# Patient Record
Sex: Male | Born: 1955 | Race: Black or African American | Hispanic: No | Marital: Married | Smoking: Former smoker
Health system: Southern US, Community
[De-identification: ages and names within clinical notes are randomized; demographics above are authoritative.]

## PROBLEM LIST (undated history)

## (undated) DIAGNOSIS — I219 Acute myocardial infarction, unspecified: Secondary | ICD-10-CM

## (undated) DIAGNOSIS — I10 Essential (primary) hypertension: Secondary | ICD-10-CM

## (undated) DIAGNOSIS — E785 Hyperlipidemia, unspecified: Secondary | ICD-10-CM

## (undated) DIAGNOSIS — K649 Unspecified hemorrhoids: Secondary | ICD-10-CM

## (undated) HISTORY — DX: Essential (primary) hypertension: I10

## (undated) HISTORY — PX: APPENDECTOMY: SHX54

## (undated) HISTORY — DX: Hyperlipidemia, unspecified: E78.5

## (undated) HISTORY — DX: Acute myocardial infarction, unspecified: I21.9

## (undated) HISTORY — DX: Unspecified hemorrhoids: K64.9

---

## 2004-02-02 ENCOUNTER — Other Ambulatory Visit: Payer: Self-pay

## 2004-07-30 ENCOUNTER — Emergency Department: Payer: Self-pay | Admitting: Emergency Medicine

## 2004-08-24 ENCOUNTER — Emergency Department: Payer: Self-pay | Admitting: Emergency Medicine

## 2004-09-22 ENCOUNTER — Ambulatory Visit: Payer: Self-pay | Admitting: Internal Medicine

## 2004-11-17 ENCOUNTER — Ambulatory Visit: Payer: Self-pay | Admitting: Gastroenterology

## 2005-04-09 ENCOUNTER — Emergency Department: Payer: Self-pay | Admitting: Emergency Medicine

## 2005-07-31 ENCOUNTER — Emergency Department: Payer: Self-pay | Admitting: Emergency Medicine

## 2006-10-14 ENCOUNTER — Emergency Department: Payer: Self-pay | Admitting: Emergency Medicine

## 2006-10-22 HISTORY — PX: OTHER SURGICAL HISTORY: SHX169

## 2006-11-06 ENCOUNTER — Other Ambulatory Visit: Payer: Self-pay

## 2006-11-06 ENCOUNTER — Emergency Department: Payer: Self-pay | Admitting: Unknown Physician Specialty

## 2007-02-02 ENCOUNTER — Emergency Department: Payer: Self-pay | Admitting: Emergency Medicine

## 2007-04-08 ENCOUNTER — Emergency Department: Payer: Self-pay | Admitting: Unknown Physician Specialty

## 2007-04-08 ENCOUNTER — Other Ambulatory Visit: Payer: Self-pay

## 2007-04-13 ENCOUNTER — Observation Stay: Payer: Self-pay | Admitting: Internal Medicine

## 2007-04-20 ENCOUNTER — Emergency Department: Payer: Self-pay | Admitting: Emergency Medicine

## 2007-05-02 ENCOUNTER — Emergency Department: Payer: Self-pay | Admitting: Emergency Medicine

## 2007-05-02 ENCOUNTER — Other Ambulatory Visit: Payer: Self-pay

## 2007-05-08 ENCOUNTER — Emergency Department: Payer: Self-pay | Admitting: Internal Medicine

## 2007-05-08 ENCOUNTER — Other Ambulatory Visit: Payer: Self-pay

## 2007-07-29 ENCOUNTER — Emergency Department: Payer: Self-pay | Admitting: Internal Medicine

## 2007-07-29 ENCOUNTER — Other Ambulatory Visit: Payer: Self-pay

## 2007-09-15 ENCOUNTER — Emergency Department: Payer: Self-pay | Admitting: Emergency Medicine

## 2007-10-18 ENCOUNTER — Other Ambulatory Visit: Payer: Self-pay

## 2007-10-18 ENCOUNTER — Emergency Department: Payer: Self-pay | Admitting: Emergency Medicine

## 2007-11-05 ENCOUNTER — Ambulatory Visit: Payer: Self-pay | Admitting: Internal Medicine

## 2007-11-06 ENCOUNTER — Ambulatory Visit: Payer: Self-pay | Admitting: Internal Medicine

## 2007-11-12 ENCOUNTER — Other Ambulatory Visit: Payer: Self-pay

## 2007-11-12 ENCOUNTER — Emergency Department: Payer: Self-pay | Admitting: Emergency Medicine

## 2007-12-27 ENCOUNTER — Emergency Department: Payer: Self-pay | Admitting: Emergency Medicine

## 2008-01-06 ENCOUNTER — Emergency Department: Payer: Self-pay | Admitting: Emergency Medicine

## 2008-01-06 ENCOUNTER — Other Ambulatory Visit: Payer: Self-pay

## 2008-01-13 ENCOUNTER — Emergency Department: Payer: Self-pay | Admitting: Emergency Medicine

## 2008-01-13 ENCOUNTER — Other Ambulatory Visit: Payer: Self-pay

## 2008-01-29 ENCOUNTER — Emergency Department: Payer: Self-pay | Admitting: Emergency Medicine

## 2008-01-29 ENCOUNTER — Other Ambulatory Visit: Payer: Self-pay

## 2008-02-23 ENCOUNTER — Emergency Department: Payer: Self-pay | Admitting: Emergency Medicine

## 2008-02-23 ENCOUNTER — Other Ambulatory Visit: Payer: Self-pay

## 2008-02-26 ENCOUNTER — Emergency Department: Payer: Self-pay | Admitting: Emergency Medicine

## 2008-03-11 ENCOUNTER — Emergency Department: Payer: Self-pay | Admitting: Emergency Medicine

## 2008-03-11 ENCOUNTER — Other Ambulatory Visit: Payer: Self-pay

## 2008-04-17 ENCOUNTER — Emergency Department: Payer: Self-pay | Admitting: Emergency Medicine

## 2008-05-09 ENCOUNTER — Emergency Department: Payer: Self-pay | Admitting: Emergency Medicine

## 2008-05-09 ENCOUNTER — Other Ambulatory Visit: Payer: Self-pay

## 2008-06-13 ENCOUNTER — Emergency Department: Payer: Self-pay | Admitting: Internal Medicine

## 2008-06-13 ENCOUNTER — Other Ambulatory Visit: Payer: Self-pay

## 2008-06-16 ENCOUNTER — Other Ambulatory Visit: Payer: Self-pay

## 2008-06-16 ENCOUNTER — Emergency Department: Payer: Self-pay | Admitting: Emergency Medicine

## 2008-07-30 ENCOUNTER — Emergency Department: Payer: Self-pay | Admitting: Emergency Medicine

## 2008-08-08 ENCOUNTER — Emergency Department: Payer: Self-pay | Admitting: Emergency Medicine

## 2008-08-28 ENCOUNTER — Inpatient Hospital Stay: Payer: Self-pay | Admitting: Internal Medicine

## 2008-09-13 ENCOUNTER — Emergency Department: Payer: Self-pay | Admitting: Emergency Medicine

## 2008-09-17 ENCOUNTER — Emergency Department: Payer: Self-pay | Admitting: Emergency Medicine

## 2008-10-28 ENCOUNTER — Ambulatory Visit: Payer: Self-pay | Admitting: Internal Medicine

## 2008-12-03 ENCOUNTER — Emergency Department: Payer: Self-pay | Admitting: Emergency Medicine

## 2008-12-03 ENCOUNTER — Ambulatory Visit: Payer: Self-pay | Admitting: Cardiovascular Disease

## 2008-12-09 ENCOUNTER — Ambulatory Visit: Payer: Self-pay | Admitting: Cardiovascular Disease

## 2008-12-27 IMAGING — CR DG CHEST 1V PORT
1 series · 1 of 1 positions shown · non-contrast
Comparison: none

REASON FOR EXAM: jaw pain abnormal ekg
COMMENTS:

[view not recorded]
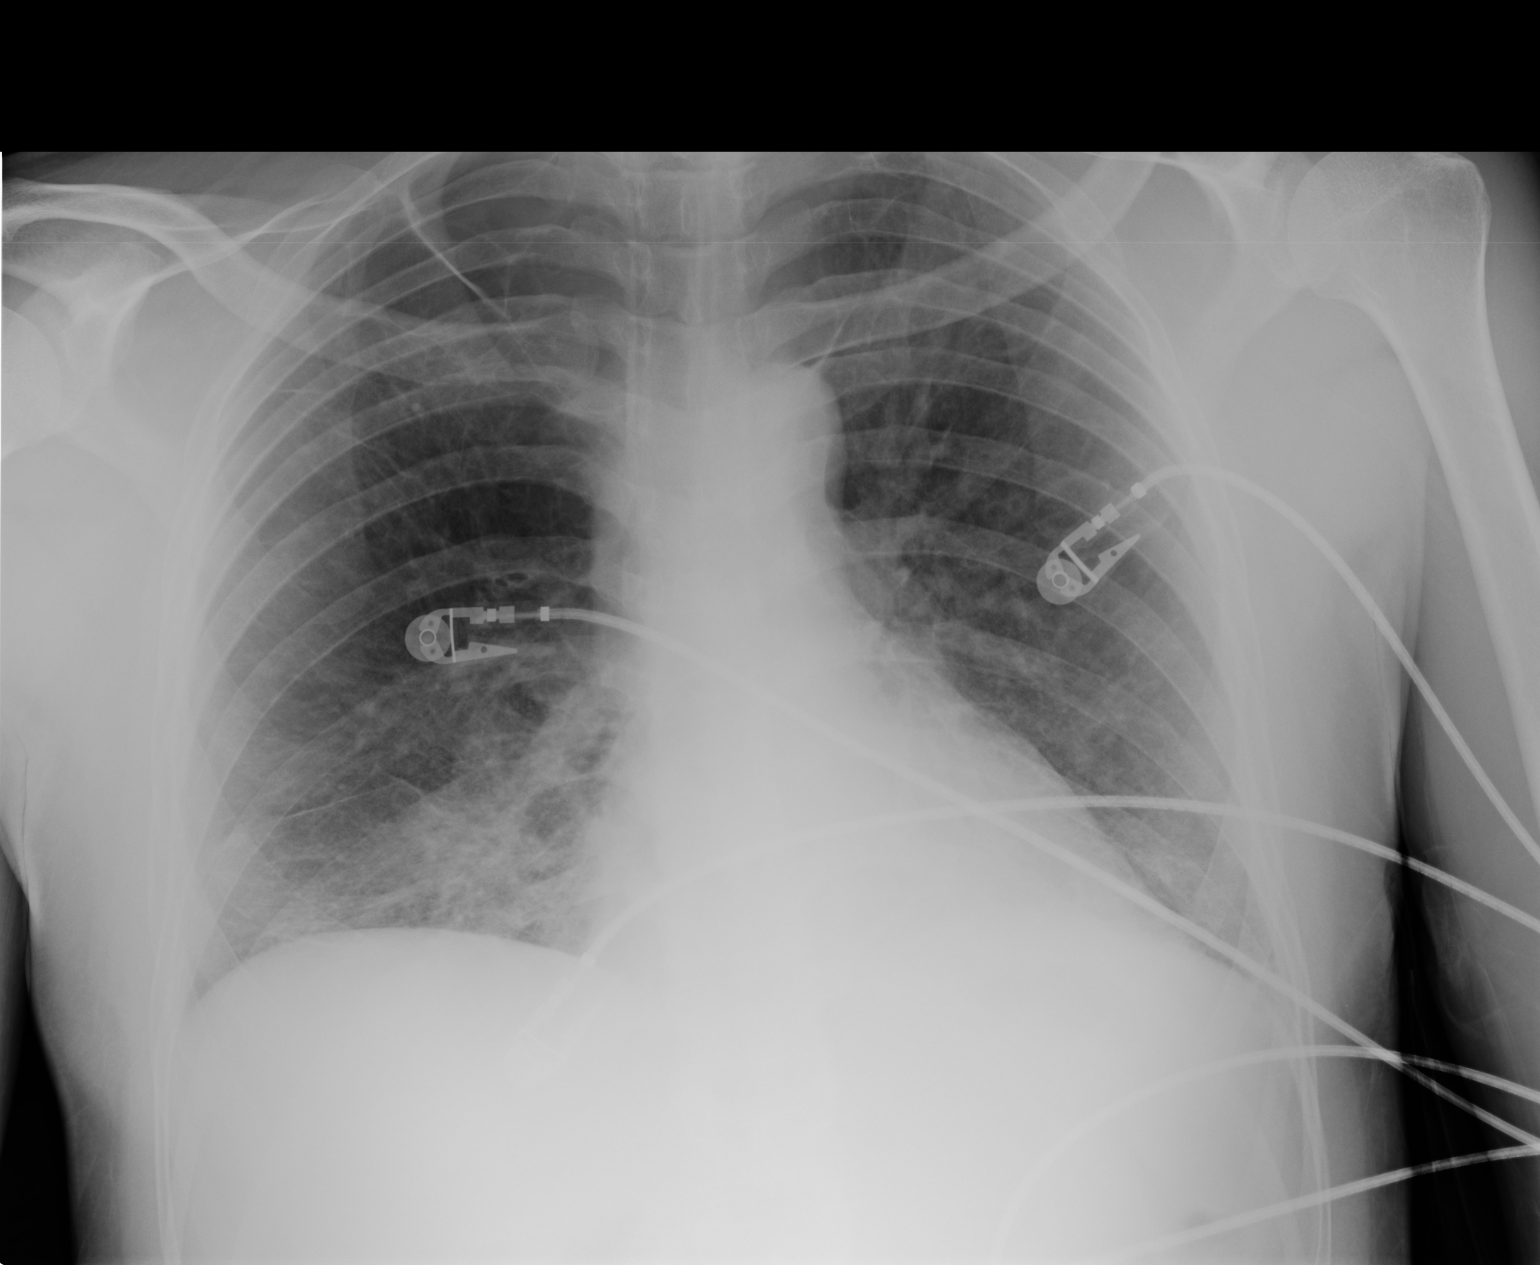

[1 of 1 positions shown; findings below may reference images not displayed]

PROCEDURE:     DXR - DXR PORTABLE CHEST SINGLE VIEW  - April 08, 2007  [DATE]

RESULT:     Comparison is made to an image dated 10/14/2006. There is
increased density at the RIGHT lung base consistent with pneumonia. Linear
density is seen at the RIGHT lung apex. There appear to be bullous changes
at the RIGHT lung apex and this may be a band of fibrosis. An azygos lobe
could give a similar appearance. Cardiac monitoring electrodes are present.
The cardiac silhouette is not enlarged.
IMPRESSION: 1. RIGHT basilar pneumonia.
2. Possible bullous changes versus azygos lobe at the RIGHT lung apex.

## 2008-12-28 ENCOUNTER — Emergency Department: Payer: Self-pay | Admitting: Emergency Medicine

## 2009-01-20 IMAGING — CR DG CHEST 1V PORT
1 series · 1 of 1 positions shown · non-contrast
Comparison: none

REASON FOR EXAM: chest pain
COMMENTS:

[view not recorded]
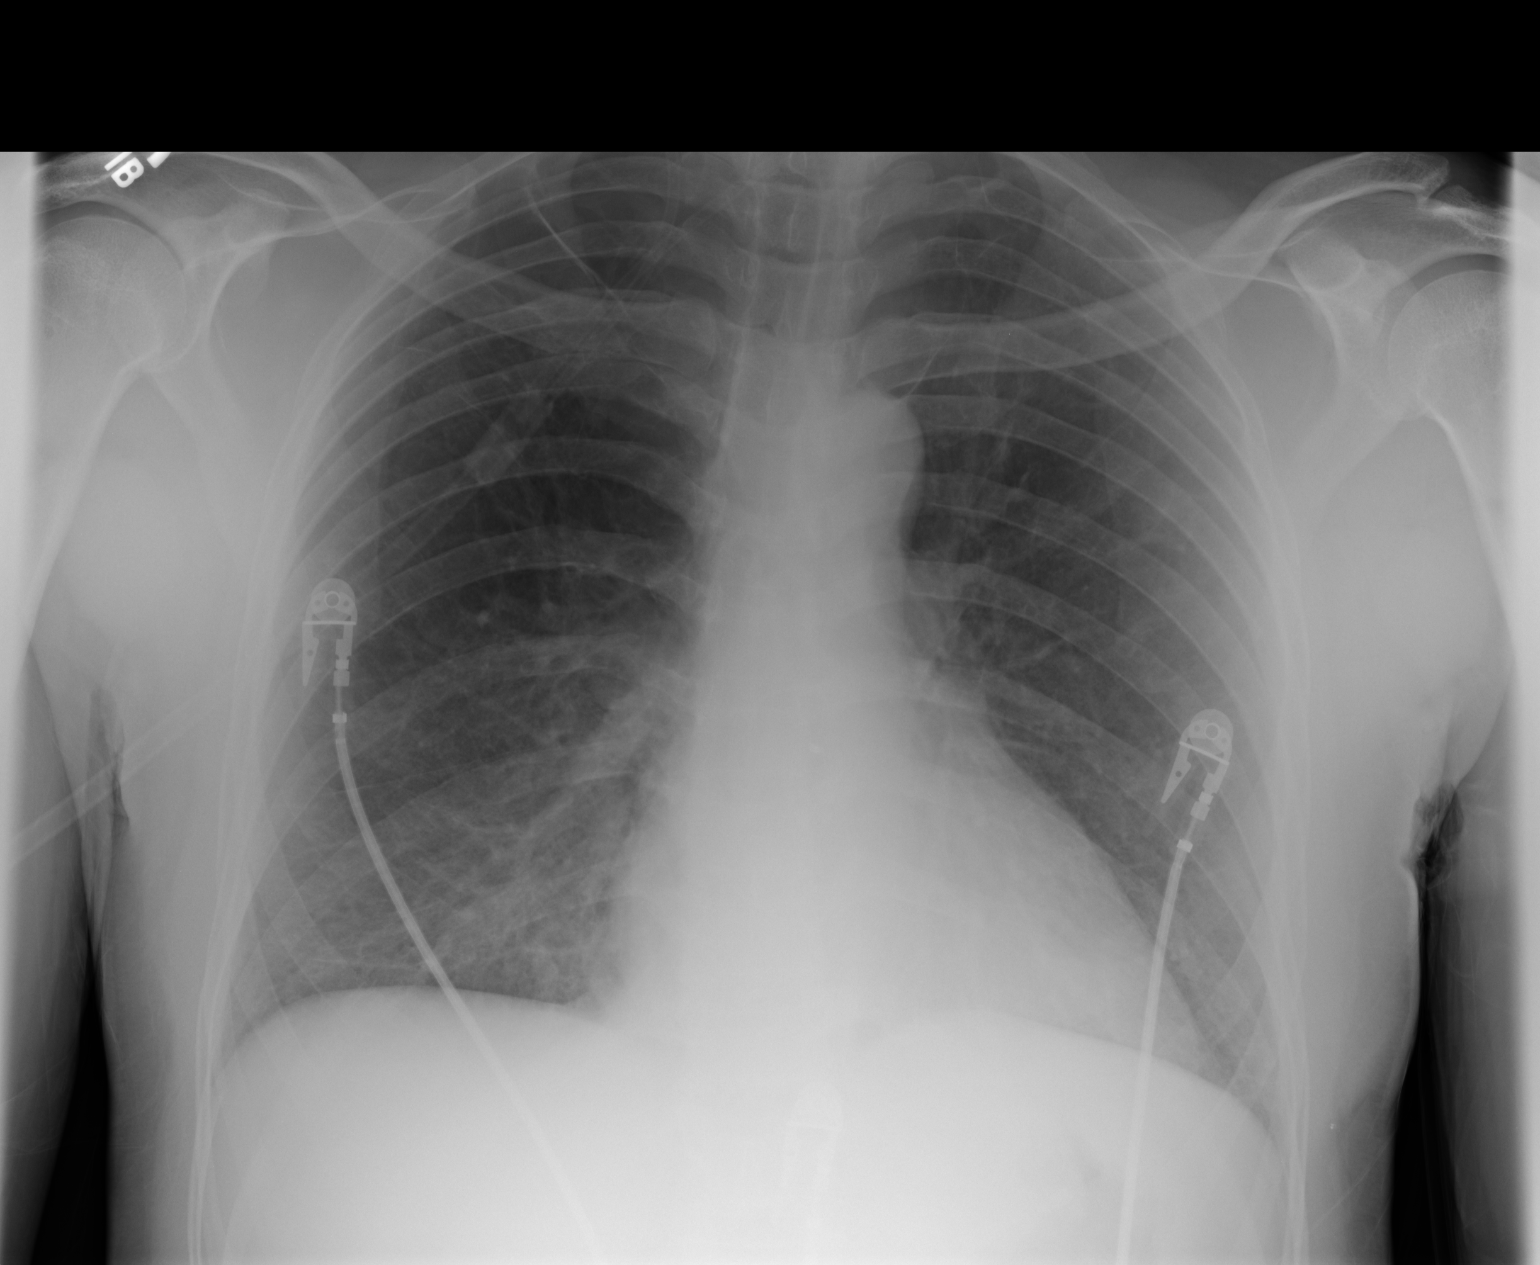

[1 of 1 positions shown; findings below may reference images not displayed]

PROCEDURE:     DXR - DXR PORTABLE CHEST SINGLE VIEW  - May 02, 2007 [DATE]

RESULT:     Comparison is made to a study 08 April, 2007.

The lungs are well-expanded and clear. There is a linear density in the
right apex consistent with an azygos lobe type anatomy. This is stable. The
heart is not enlarged. The pulmonary vascularity is not engorged.
IMPRESSION: I do not see evidence of acute cardiopulmonary disease.

## 2009-01-26 IMAGING — CR DG CHEST 1V PORT
1 series · 1 of 1 positions shown · non-contrast
Comparison: none

REASON FOR EXAM: chest pain        rm 4
COMMENTS:

[view not recorded]
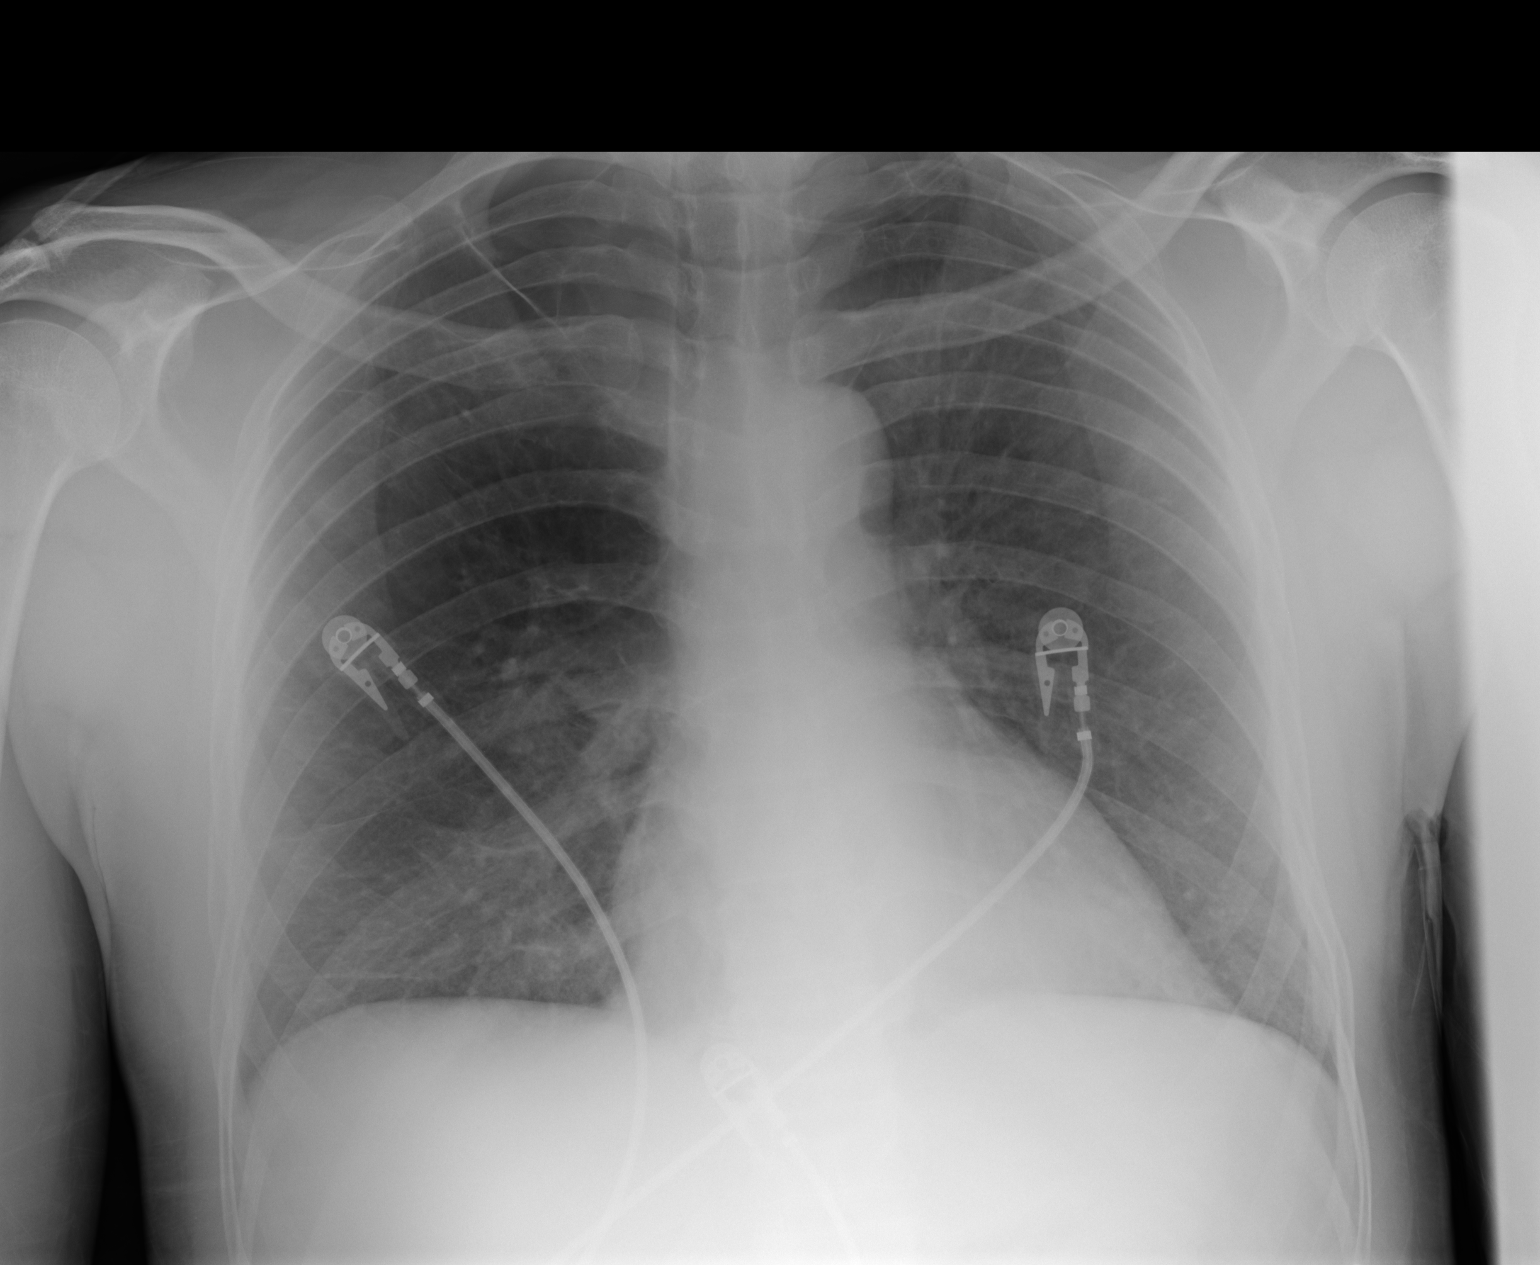

[1 of 1 positions shown; findings below may reference images not displayed]

PROCEDURE:     DXR - DXR PORTABLE CHEST SINGLE VIEW  - May 08, 2007 [DATE]

RESULT:     Comparison is made to the prior exam of 05/02/2007. The current
examination shows the lung fields to be clear. No pneumonia, pneumothorax or
pleural effusion is seen.  The heart size is normal.  Monitoring electrodes
are present.
IMPRESSION: No changes are identified.

## 2009-03-19 ENCOUNTER — Emergency Department: Payer: Self-pay | Admitting: Emergency Medicine

## 2009-05-23 ENCOUNTER — Emergency Department: Payer: Self-pay | Admitting: Emergency Medicine

## 2009-06-19 ENCOUNTER — Emergency Department: Payer: Self-pay | Admitting: Emergency Medicine

## 2009-07-08 IMAGING — CR DG ABDOMEN 3V
1 series · 4 of 4 positions shown · non-contrast
Comparison: none

REASON FOR EXAM: Pain
COMMENTS:

[Series 1: view not recorded · 0.17mm/px · 4 of 4 slices shown]
[im 1/4]
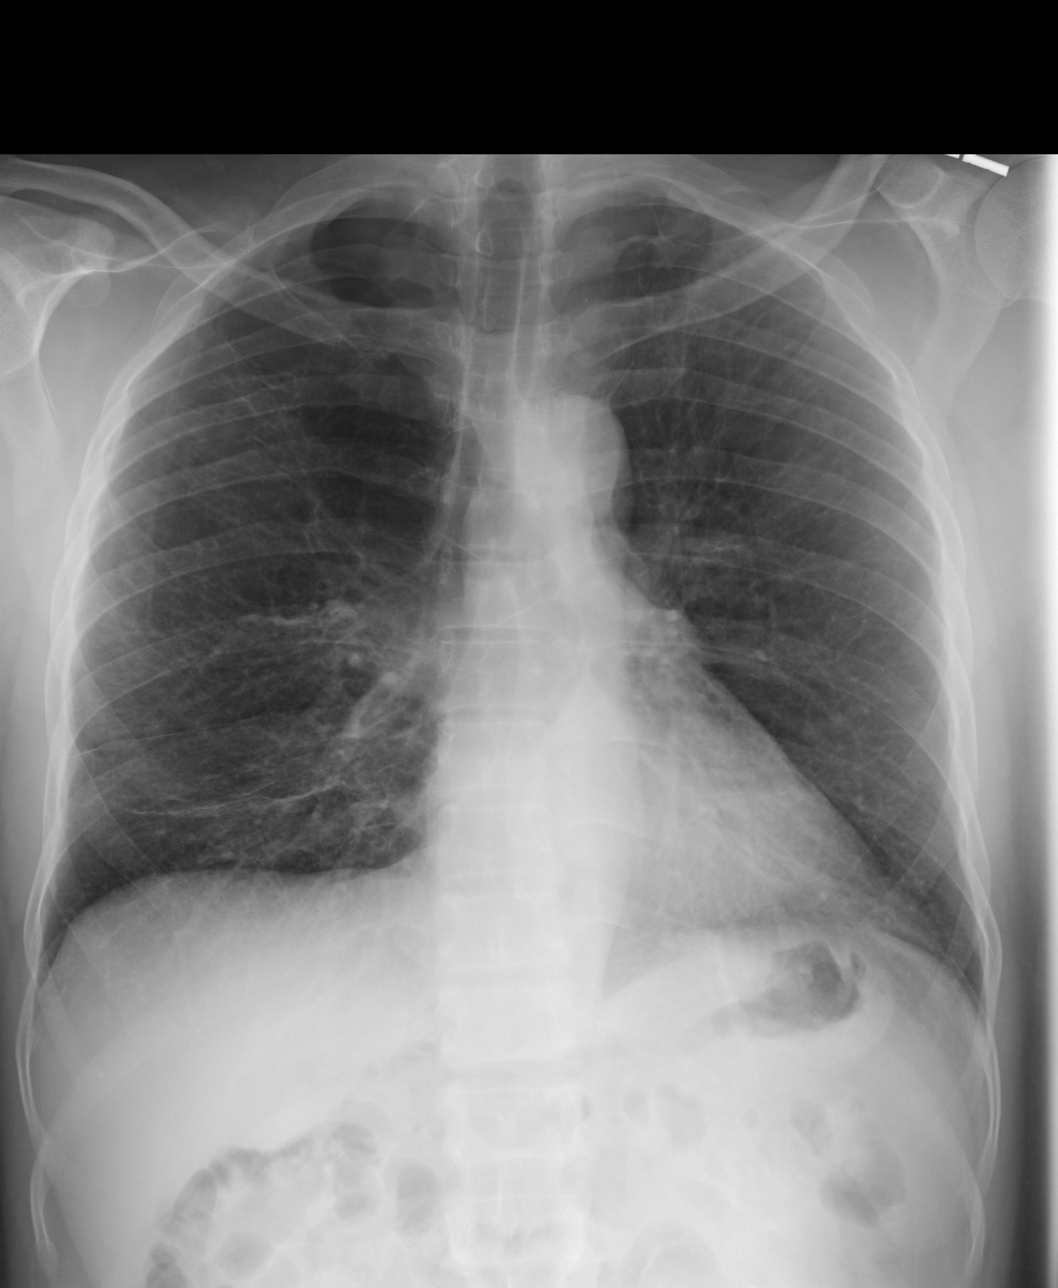
[im 2/4]
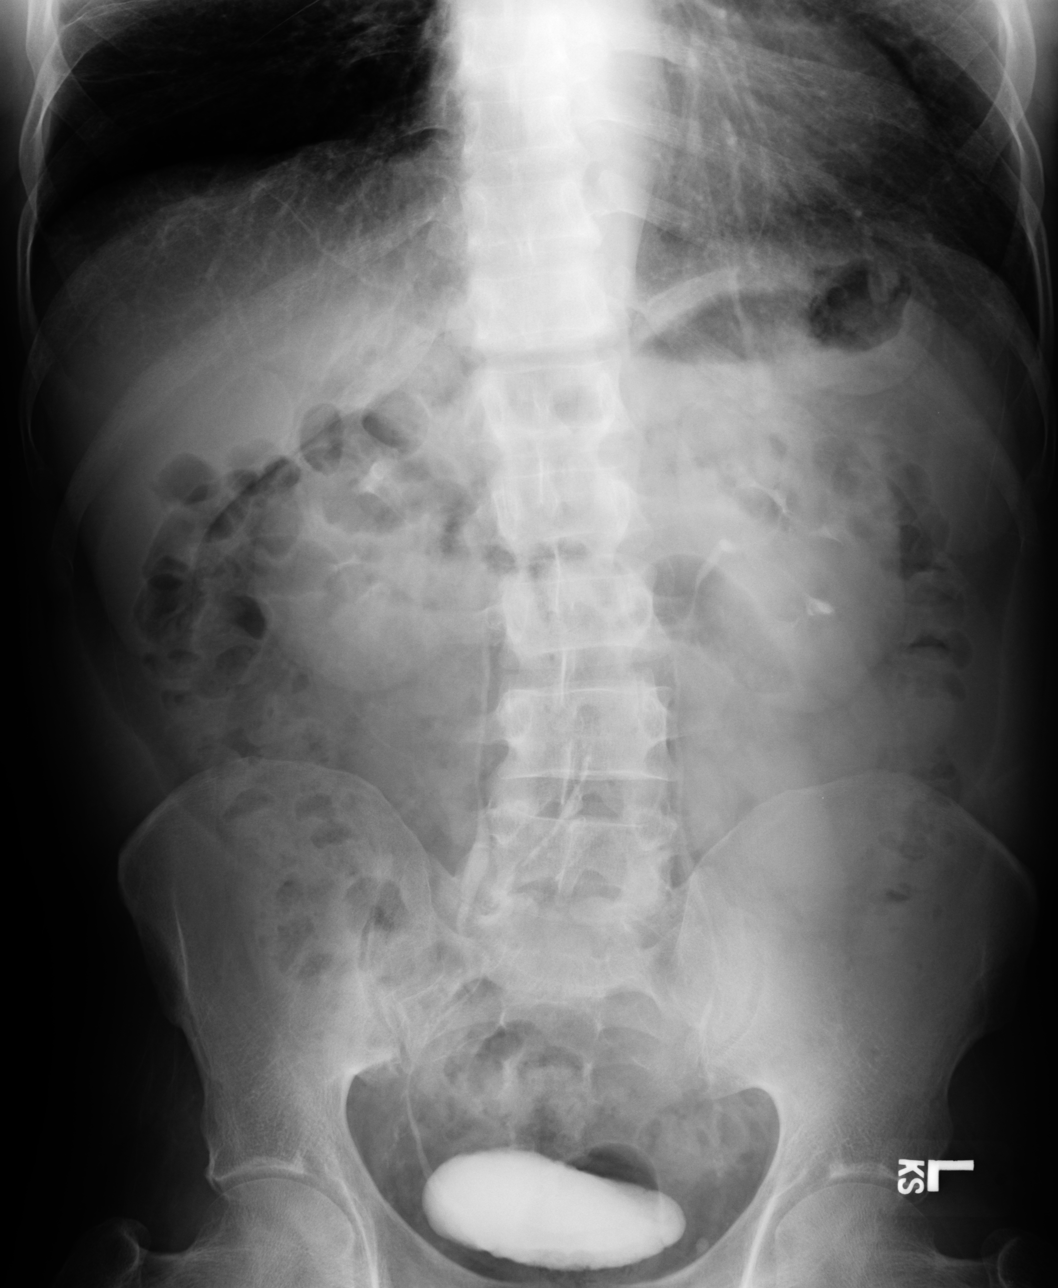
[im 3/4]
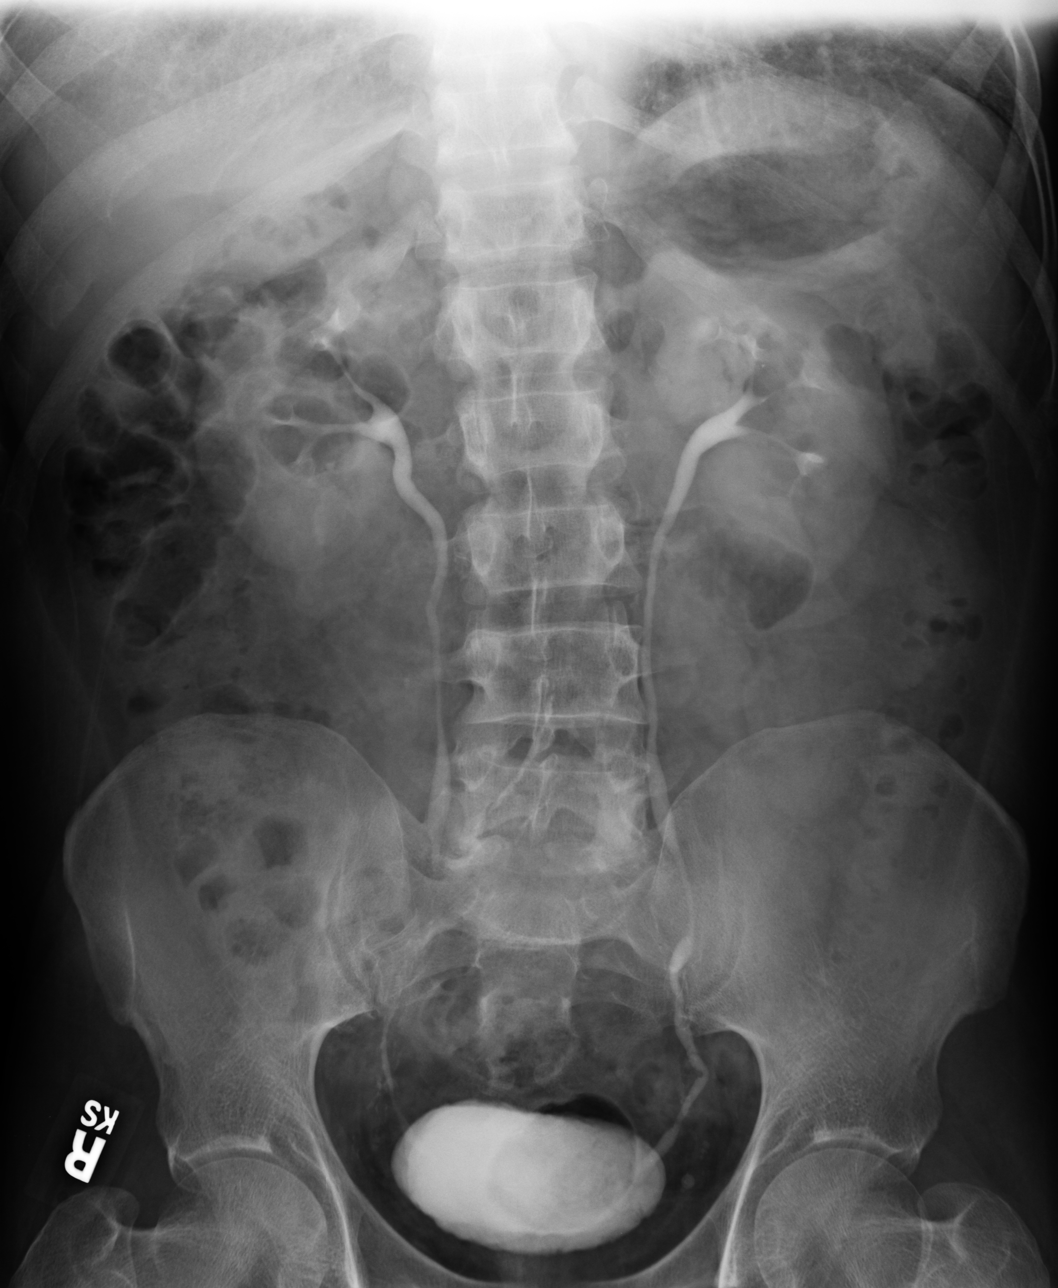
[im 4/4]
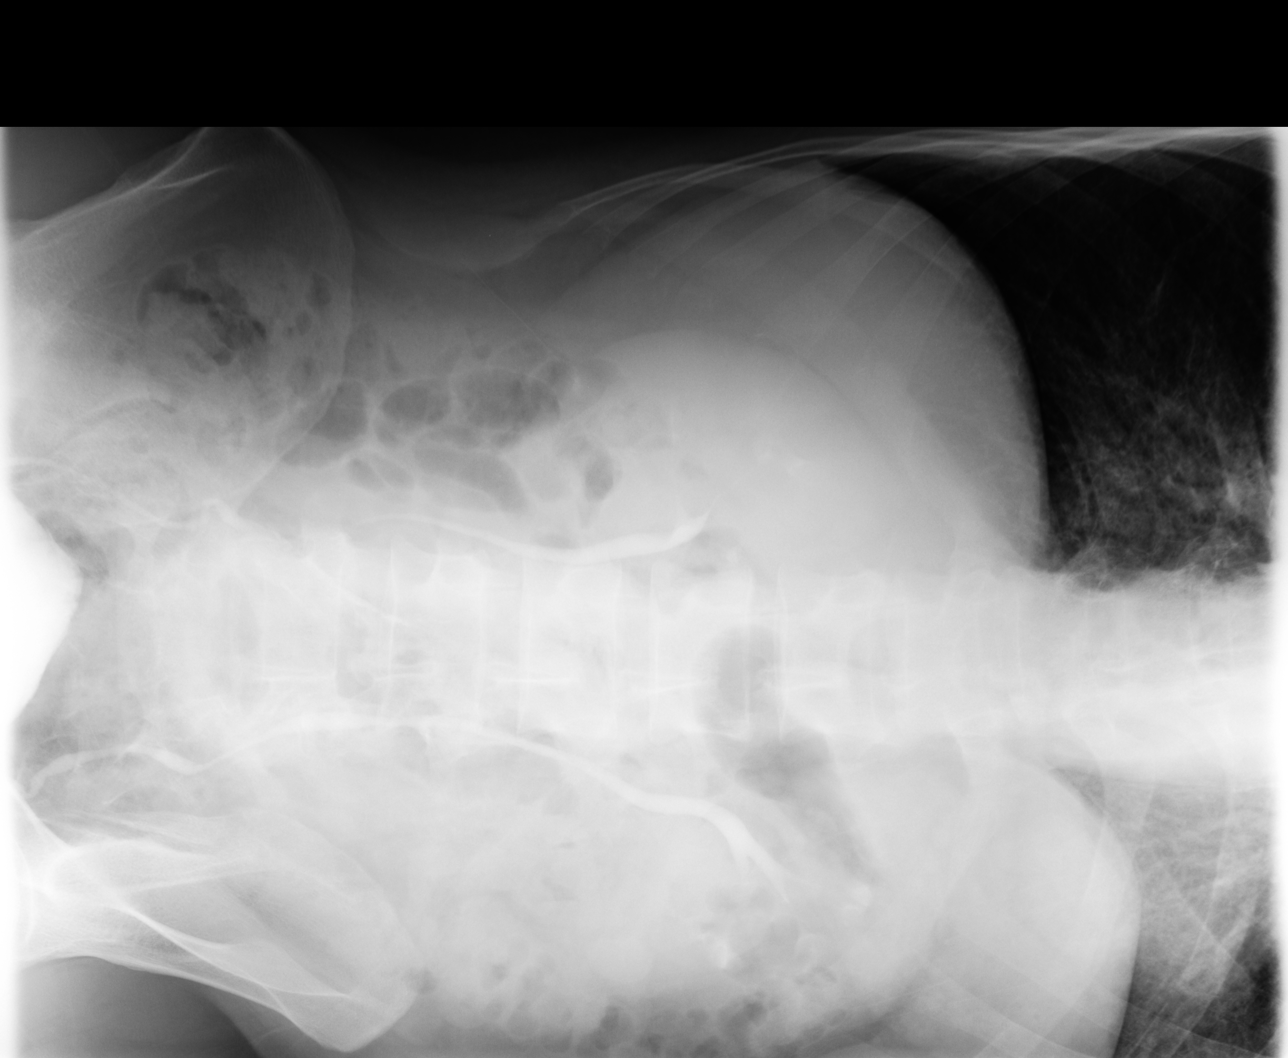

[4 of 4 positions shown; findings below may reference images not displayed]

PROCEDURE:     DXR - DXR ABDOMEN 3-WAY (INCL PA CXR)  - October 18, 2007 [DATE]

RESULT:     Comparison is made to the study of 07/29/2007.

There is persistent atelectasis at the RIGHT lung base.

There is excretion of contrast by both kidneys with opacification of the
urine in the bladder being demonstrated. There is no abnormal bowel
distension. There is no free air. The bony structures are unremarkable.
IMPRESSION: 1.  No acute cardiopulmonary disease evident. Stable appearance compared to
07/29/2007.
2.  No evidence of bowel obstruction or perforation.

## 2009-07-19 ENCOUNTER — Ambulatory Visit: Payer: Self-pay | Admitting: Cardiovascular Disease

## 2009-08-15 ENCOUNTER — Emergency Department: Payer: Self-pay | Admitting: Emergency Medicine

## 2009-08-29 ENCOUNTER — Emergency Department: Payer: Self-pay | Admitting: Emergency Medicine

## 2009-10-19 ENCOUNTER — Emergency Department: Payer: Self-pay | Admitting: Emergency Medicine

## 2009-10-19 IMAGING — CT CT STONE STUDY
1 of 2 series · 15 of 32 positions shown, 19 images · non-contrast
Comparison: none

REASON FOR EXAM: flank pain
COMMENTS:

[Series 2: stone · axial · 0.68mm/px · z∈[-300,+86]mm · 15 of 146 slices shown, 19 images]
[im 11/146  soft-tissue]
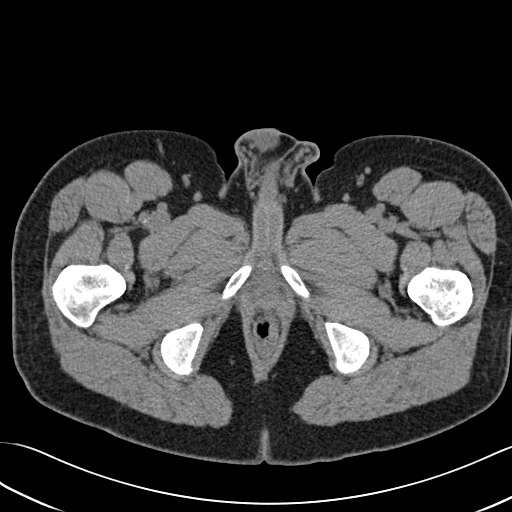
[im 11/146  bone]
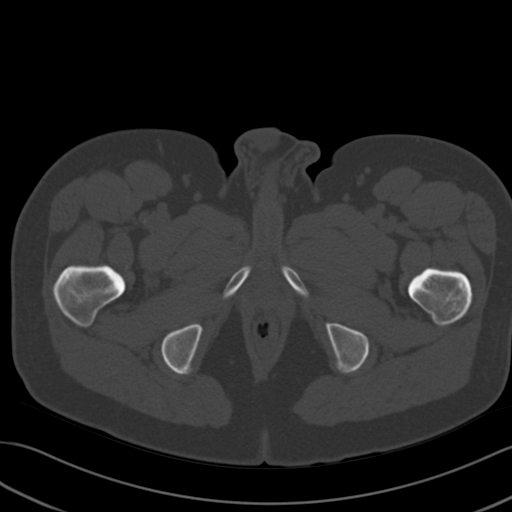
[im 21/146  soft-tissue]
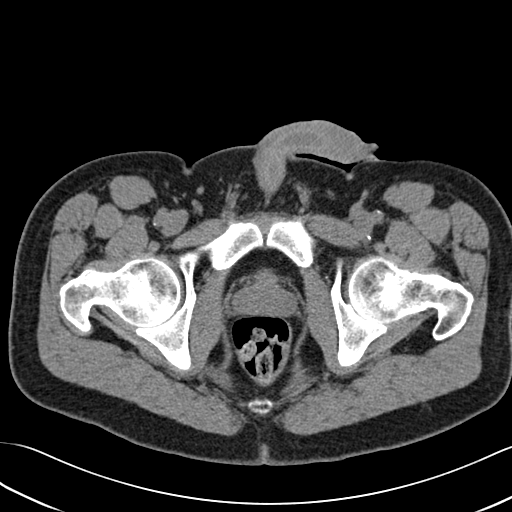
[im 32/146  soft-tissue]
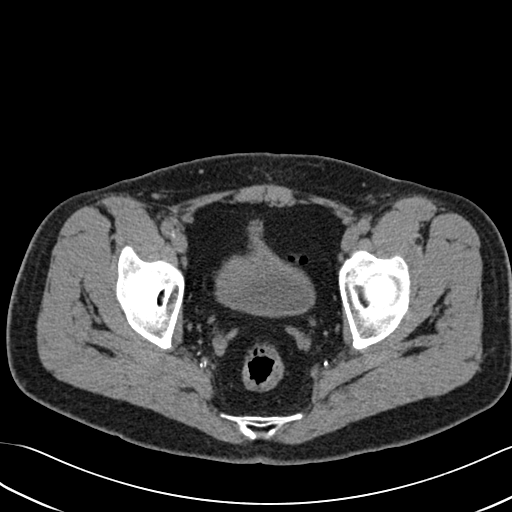
[im 42/146  soft-tissue]
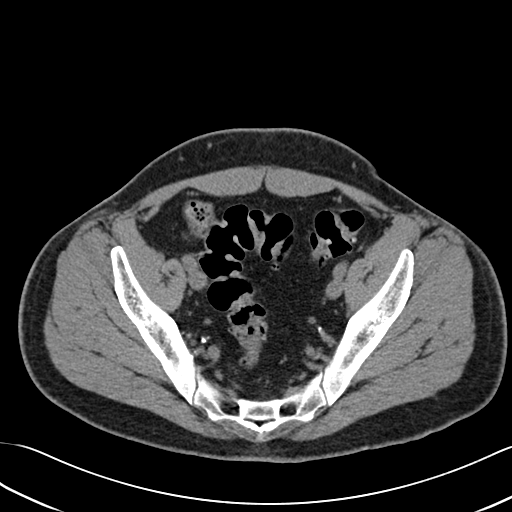
[im 52/146  soft-tissue]
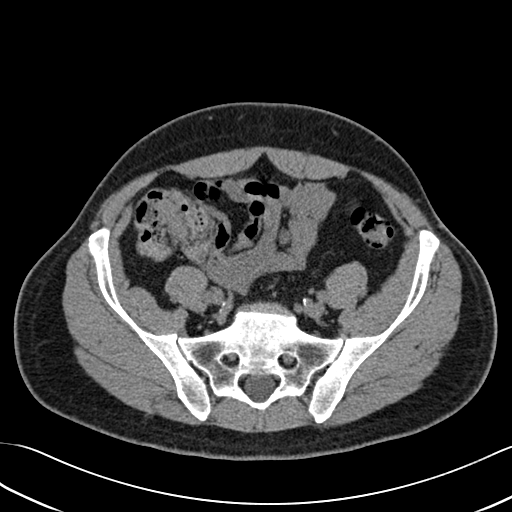
[im 63/146  soft-tissue]
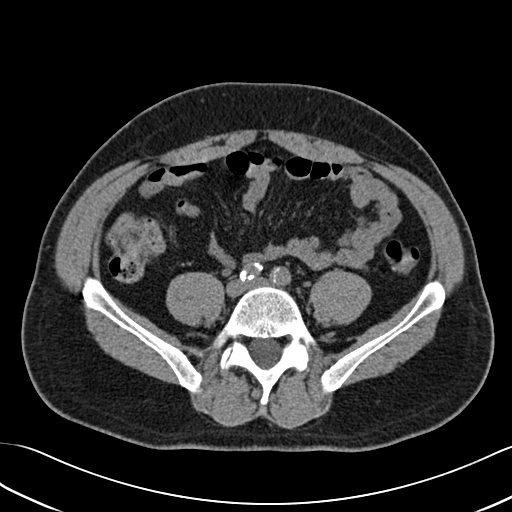
[im 73/146  soft-tissue]
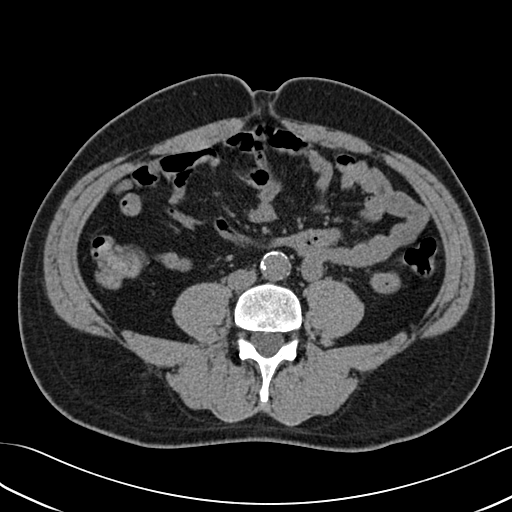
[im 83/146  soft-tissue]
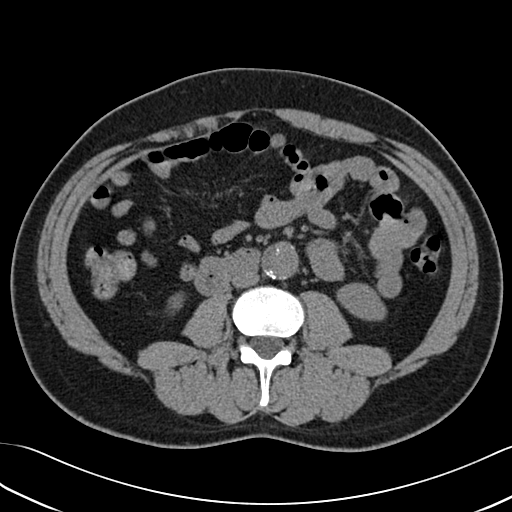
[im 94/146  soft-tissue]
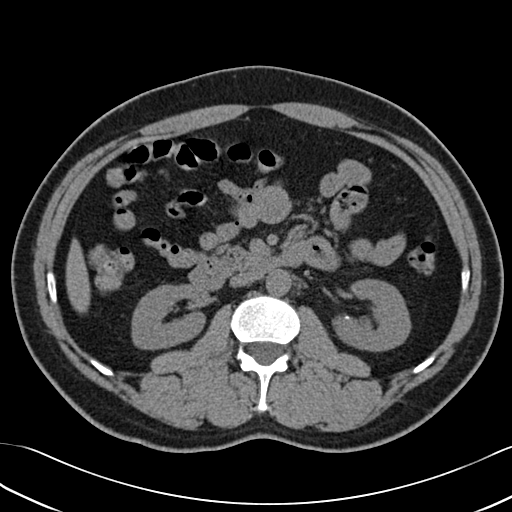
[im 94/146  bone]
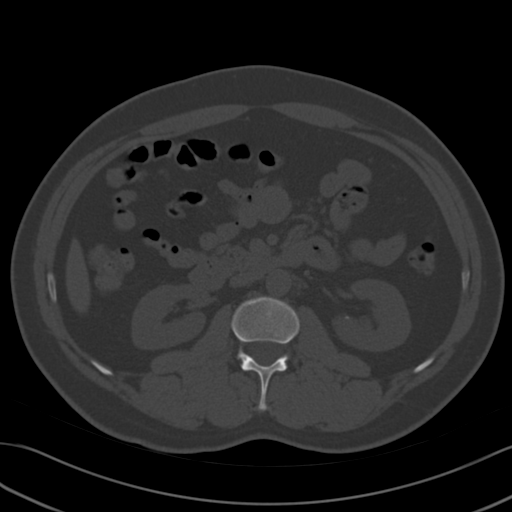
[im 104/146  soft-tissue]
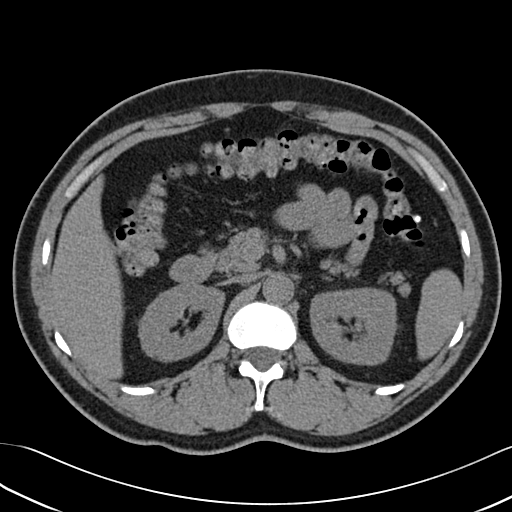
[im 114/146  soft-tissue]
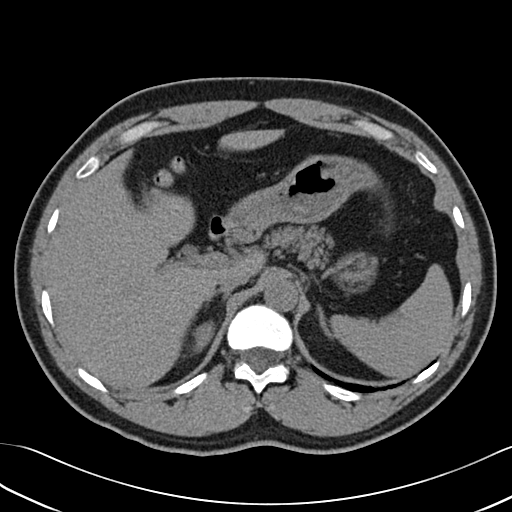
[im 125/146  soft-tissue]
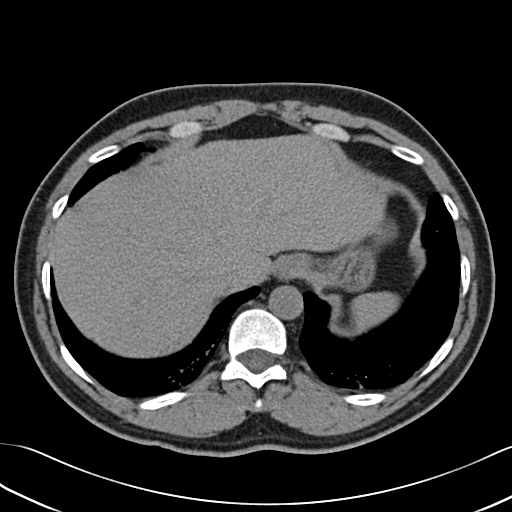
[im 125/146  lung]
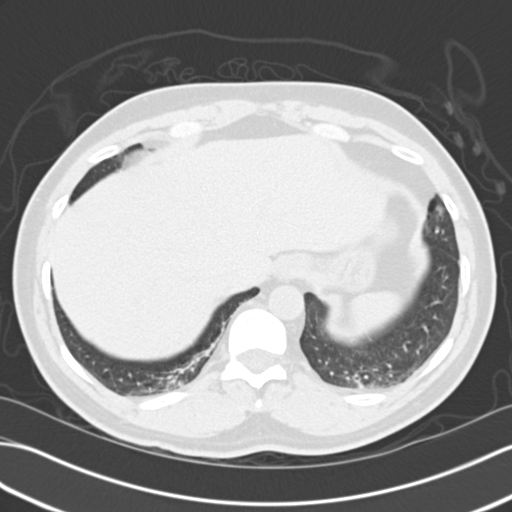
[im 130/146  lung]
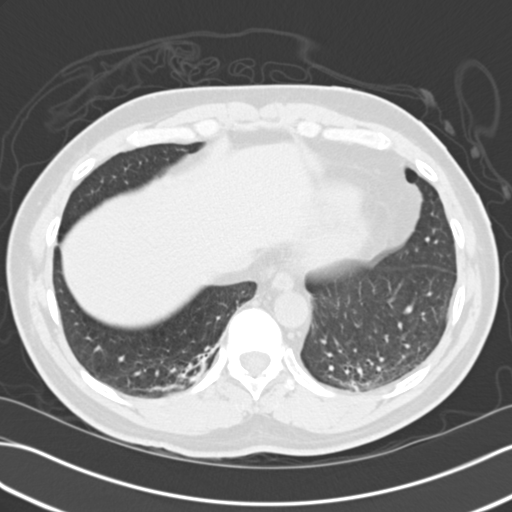
[im 135/146  soft-tissue]
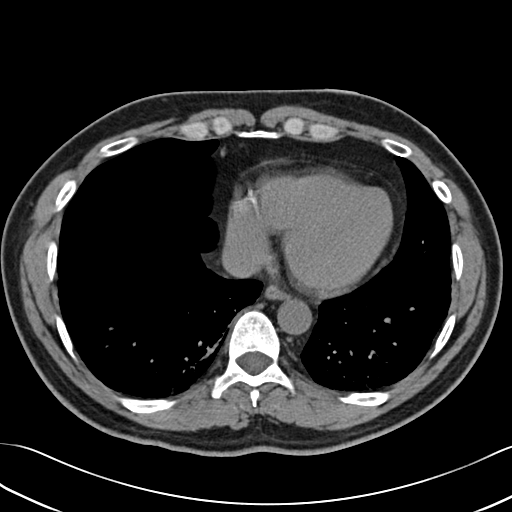
[im 135/146  lung]
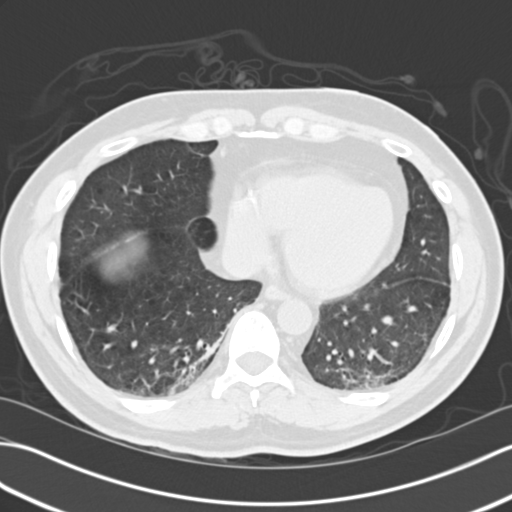
[im 140/146  lung]
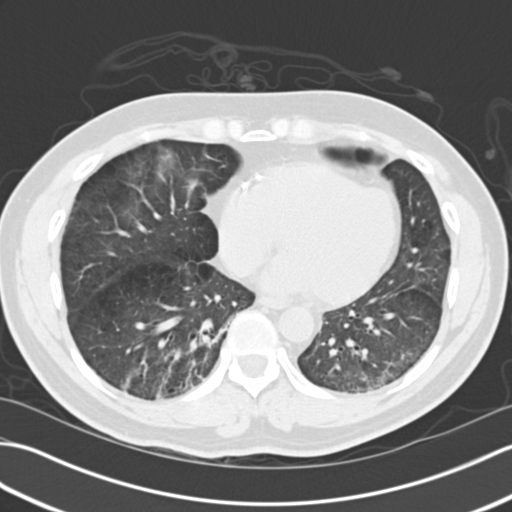

[15 of 32 positions shown; findings below may reference images not displayed]

PROCEDURE:     CT  - CT ABDOMEN /PELVIS WO (STONE)  - January 29, 2008  [DATE]

RESULT:     Emergent noncontrast renal stone protocol CT of the abdomen and
pelvis is performed. There is a previous contrast-enhanced study dated
10/18/2007 for comparison.

Images through the base of the lungs demonstrate emphysematous changes with
some minimal RIGHT middle lobe and lower lobe atelectasis. No definite
infiltrate is seen.

There is calcification in the renal hilar regions consistent with
atherosclerotic calcification. No radiopaque gallstones or renal calculi are
evident. There is no urinary or bowel obstruction. There is ectasia and
atherosclerotic calcification of the abdominal aorta. There is no ascites or
free air. The urinary bladder is unremarkable. There is scattered colonic
diverticulosis without evidence of acute diverticulitis.
IMPRESSION: 1. No urinary obstruction or urinary calculi.
2. Emphysematous lung disease with basilar areas of atelectasis presumably
present.
3. Diverticulosis without evidence of acute diverticulitis.

## 2010-03-08 ENCOUNTER — Ambulatory Visit: Payer: Self-pay | Admitting: Cardiovascular Disease

## 2010-05-31 ENCOUNTER — Emergency Department: Payer: Self-pay | Admitting: Unknown Physician Specialty

## 2010-10-18 ENCOUNTER — Emergency Department: Payer: Self-pay | Admitting: Emergency Medicine

## 2010-10-21 ENCOUNTER — Emergency Department: Payer: Self-pay | Admitting: Emergency Medicine

## 2011-10-23 HISTORY — PX: COLONOSCOPY: SHX174

## 2011-12-29 ENCOUNTER — Emergency Department: Payer: Self-pay | Admitting: Emergency Medicine

## 2012-10-30 ENCOUNTER — Emergency Department: Payer: Self-pay | Admitting: Emergency Medicine

## 2014-05-24 ENCOUNTER — Encounter: Payer: Self-pay | Admitting: *Deleted

## 2014-06-02 ENCOUNTER — Ambulatory Visit (INDEPENDENT_AMBULATORY_CARE_PROVIDER_SITE_OTHER): Payer: BC Managed Care – PPO | Admitting: General Surgery

## 2014-06-02 ENCOUNTER — Encounter: Payer: Self-pay | Admitting: General Surgery

## 2014-06-02 VITALS — BP 128/74 | HR 76 | Resp 12 | Ht 67.0 in | Wt 180.0 lb

## 2014-06-02 DIAGNOSIS — K648 Other hemorrhoids: Secondary | ICD-10-CM

## 2014-06-02 NOTE — Progress Notes (Signed)
isaPatient ID: Noah Dillon, male   DOB: 02/05/1956, 58 y.o.   MRN: [id number]  Chief Complaint  Patient presents with  . Other    bleeding hemorrhoids    HPI Noah Dillon is a 58 y.o. male here today of a evaluation of hemorrhoids.  He states he has had bleeding on 2 occasions over the past year. He notices blood in the toilet bowl and on the toilet paper. No blood mixed in with the stool.Patient states he been bleeding bright red blood   He states no pain. No bleeding at this time.    HPI  Past Medical History  Diagnosis Date  . Hypertension   . Hyperlipidemia   . Hemorrhoids   . Myocardial infarction     Past Surgical History  Procedure Laterality Date  . Appendectomy    . Heart stent  2008  . Colonoscopy  2013    Dr. Carollee MassedIftakar    No family history on file.  Social History History  Substance Use Topics  . Smoking status: Former Smoker -- 1.00 packs/day for 8 years    Types: Cigarettes  . Smokeless tobacco: Never Used  . Alcohol Use: No    No Known Allergies  Current Outpatient Prescriptions  Medication Sig Dispense Refill  . aspirin 81 MG tablet Take 81 mg by mouth daily.      . clopidogrel (PLAVIX) 75 MG tablet Take 75 mg by mouth daily.      . hydrochlorothiazide (HYDRODIURIL) 25 MG tablet Take 25 mg by mouth daily.      . iron polysaccharides (NIFEREX) 150 MG capsule Take 150 mg by mouth daily.      . metoprolol succinate (TOPROL-XL) 100 MG 24 hr tablet Take 100 mg by mouth daily. Take with or immediately following a meal.      . ranitidine (ZANTAC) 150 MG capsule Take 150 mg by mouth 2 (two) times daily.      . rosuvastatin (CRESTOR) 40 MG tablet Take 40 mg by mouth daily.       No current facility-administered medications for this visit.    Review of Systems Review of Systems  Constitutional: Negative.   Respiratory: Negative.   Cardiovascular: Negative.   Gastrointestinal: Positive for anal bleeding. Negative for nausea, vomiting, abdominal pain,  diarrhea, constipation, blood in stool, abdominal distention and rectal pain.    Blood pressure 128/74, pulse 76, resp. rate 12, height 5\' 7"  (1.702 m), weight 180 lb (81.647 kg).  Physical Exam Physical Exam  Constitutional: He appears well-developed and well-nourished.  Cardiovascular: Normal rate and regular rhythm.   Pulmonary/Chest: Effort normal and breath sounds normal.  Abdominal: Soft.  Genitourinary: Prostate normal. Rectal exam shows internal hemorrhoid (less than 1 cminternal hemorrhoids at the 4 o'clock position. No bleeding.). Rectal exam shows no external hemorrhoid and no fissure. Guaiac negative stool.    Data Reviewed PCP Notes.  Assessment    Internal hemorrhoids, not presently symptomatic.     Plan    By report the patient has had a colonoscopy within the last 2 years. It is unlikely any higher source of bleeding is present based on the patient's description in frequency. If he has recurrent symptoms, banding could be considered. As he is asymptomatic at this time, the small but real risk of a complication from banding is not warranted. He has been maintained on Plavix years after his last cardiac event, and this certainly makes it more likely that minor trauma increase histhe possibility of bleeding.  Earline Mayotte 06/03/2014, 8:09 PM

## 2014-06-02 NOTE — Patient Instructions (Signed)
Patient to return as needed. The patient is aware to call back for any questions or concerns. 

## 2014-06-03 DIAGNOSIS — K648 Other hemorrhoids: Secondary | ICD-10-CM | POA: Insufficient documentation

## 2015-10-08 ENCOUNTER — Encounter: Payer: Self-pay | Admitting: Emergency Medicine

## 2015-10-08 ENCOUNTER — Emergency Department
Admission: EM | Admit: 2015-10-08 | Discharge: 2015-10-08 | Disposition: A | Discharge status: Home / Self Care | Payer: BLUE CROSS/BLUE SHIELD | Attending: Student | Admitting: Student

## 2015-10-08 DIAGNOSIS — R040 Epistaxis: Secondary | ICD-10-CM | POA: Diagnosis not present

## 2015-10-08 DIAGNOSIS — I10 Essential (primary) hypertension: Secondary | ICD-10-CM | POA: Diagnosis not present

## 2015-10-08 DIAGNOSIS — E785 Hyperlipidemia, unspecified: Secondary | ICD-10-CM | POA: Insufficient documentation

## 2015-10-08 DIAGNOSIS — Z87891 Personal history of nicotine dependence: Secondary | ICD-10-CM | POA: Insufficient documentation

## 2015-10-08 NOTE — Discharge Instructions (Signed)
Nosebleed  Nosebleeds are common. A nosebleed can be caused by many things, including:  · Getting hit hard in the nose.  · Infections.  · Dryness in your nose.  · A dry climate.  · Medicines.  · Picking your nose.  · Your home heating and cooling systems.  HOME CARE   · Try controlling your nosebleed by pinching your nostrils gently. Do this for at least 10 minutes.  · Avoid blowing or sniffing your nose for a number of hours after having a nosebleed.  · Do not put gauze inside of your nose yourself. If your nose was packed by your doctor, try to keep the pack inside of your nose until your doctor removes it.    If a gauze pack was used and it starts to fall out, gently replace it or cut off the end of it.    If a balloon catheter was used to pack your nose, do not cut or remove it unless told by your doctor.  · Avoid lying down while you are having a nosebleed. Sit up and lean forward.  · Use a nasal spray decongestant to help with a nosebleed as told by your doctor.  · Do not use petroleum jelly or mineral oil in your nose. These can drip into your lungs.  · Keep your house humid by using:    Less air conditioning.    A humidifier.  · Aspirin and blood thinners make bleeding more likely. If you are prescribed these medicines and you have nosebleeds, ask your doctor if you should stop taking the medicines or adjust the dose. Do not stop medicines unless told by your doctor.  · Resume your normal activities as you are able. Avoid straining, lifting, or bending at your waist for several days.  · If your nosebleed was caused by dryness in your nose, use over-the-counter saline nasal spray or gel. If you must use a lubricant:    Choose one that is water-soluble.    Use it only as needed.    Do not use it within several hours of lying down.  · Keep all follow-up visits as told by your doctor. This is important.  GET HELP IF:  · You have a fever.  · You get frequent nosebleeds.  · You are getting nosebleeds more  often.  GET HELP RIGHT AWAY IF:  · Your nosebleed lasts longer than 20 minutes.  · Your nosebleed occurs after an injury to your face, and your nose looks crooked or broken.  · You have unusual bleeding from other parts of your body.  · You have unusual bruising on other parts of your body.  · You feel light-headed or dizzy.  · You become sweaty.  · You throw up (vomit) blood.  · You have a nosebleed after a head injury.     This information is not intended to replace advice given to you by your health care provider. Make sure you discuss any questions you have with your health care provider.     Document Released: 07/17/2008 Document Revised: 10/29/2014 Document Reviewed: 05/24/2014  Elsevier Interactive Patient Education ©2016 [employer]

## 2015-10-08 NOTE — ED Notes (Signed)
Pt to ed with c/o nosebleed x 1 hour.  Pt with blood oozing from left nostril at triage.

## 2015-10-08 NOTE — ED Provider Notes (Signed)
[facility] Emergency Department Provider Note  ____________________________________________  Time seen: Approximately 7:57 AM  I have reviewed the triage vital signs and the nursing notes.   HISTORY  Chief Complaint Epistaxis    HPI Bandon SERENITY FORTNER is a 59 y.o. male bleed from left nostril approximately 1 hour before arrival. Patient state he blew his nose this morning resulting in nasal hemorrhaging. Patient state unable to control with direct pressure. Patient has history ofsecondary to continue use of Plavix. Patient denies any headache associated this complaint. Patient denies any pain with this complaint. No other palliative measures prior to arrival.   Past Medical History  Diagnosis Date  . Hypertension   . Hyperlipidemia   . Hemorrhoids   . Myocardial infarction [facility])     Patient Active Problem List   Diagnosis Date Noted  . Internal hemorrhoid 06/03/2014    Past Surgical History  Procedure Laterality Date  . Appendectomy    . Heart stent  2008  . Colonoscopy  2013    Dr. Carollee Massed    Current Outpatient Rx  Name  Route  Sig  Dispense  Refill  . aspirin 81 MG tablet   Oral   Take 81 mg by mouth daily.         . clopidogrel (PLAVIX) 75 MG tablet   Oral   Take 75 mg by mouth daily.         . hydrochlorothiazide (HYDRODIURIL) 25 MG tablet   Oral   Take 25 mg by mouth daily.         . iron polysaccharides (NIFEREX) 150 MG capsule   Oral   Take 150 mg by mouth daily.         . metoprolol succinate (TOPROL-XL) 100 MG 24 hr tablet   Oral   Take 100 mg by mouth daily. Take with or immediately following a meal.         . ranitidine (ZANTAC) 150 MG capsule   Oral   Take 150 mg by mouth 2 (two) times daily.         . rosuvastatin (CRESTOR) 40 MG tablet   Oral   Take 40 mg by mouth daily.           Allergies Ace inhibitors  History reviewed. No pertinent family history.  Social History Social History   Substance Use Topics  . Smoking status: Former Smoker -- 1.00 packs/day for 8 years    Types: Cigarettes  . Smokeless tobacco: Never Used  . Alcohol Use: Yes    Review of Systems Constitutional: No fever/chills Eyes: No visual changes. ENT: No sore throat. Cardiovascular: Denies chest pain. Respiratory: Denies shortness of breath. Gastrointestinal: No abdominal pain.  No nausea, no vomiting.  No diarrhea.  No constipation. Genitourinary: Negative for dysuria. Musculoskeletal: Negative for back pain. Skin: Negative for rash. Neurological: Negative for headaches, focal weakness or numbness. Endocrine:Hypertension and hyperlipidemia. Allergic/Immunilogical: Ace inhibitors  10-point ROS otherwise negative.  ____________________________________________   PHYSICAL EXAM:  VITAL SIGNS: ED Triage Vitals  Enc Vitals Group     BP 10/08/15 0748 147/77 mmHg     Pulse Rate 10/08/15 0748 91     Resp --      Temp 10/08/15 0748 98 F (36.7 C)     Temp Source 10/08/15 0748 Oral     SpO2 10/08/15 0748 100 %     Weight 10/08/15 0748 180 lb (81.647 kg)     Height 10/08/15 0748  (1.651 m)  Head Cir --      Peak Flow --      Pain Score 10/08/15 0748 0     Pain Loc --      Pain Edu? --      Excl. in GC? --     Constitutional: Alert and oriented. Well appearing and in no acute distress. Eyes: Conjunctivae are normal. PERRL. EOMI. Head: Atraumatic. Nose: No congestion/rhinnorhea. Mild bleeding anterior left nostril. Mouth/Throat: Mucous membranes are moist.  Oropharynx non-erythematous. Neck: No stridor. No cervical spine tenderness to palpation. Hematological/Lymphatic/Immunilogical: No cervical lymphadenopathy. Cardiovascular: Normal rate, regular rhythm. Grossly normal heart sounds.  Good peripheral circulation. Respiratory: Normal respiratory effort.  No retractions. Lungs CTAB. Gastrointestinal: Soft and nontender. No distention. No abdominal bruits. No CVA  tenderness. Musculoskeletal: No lower extremity tenderness nor edema.  No joint effusions. Neurologic:  Normal speech and language. No gross focal neurologic deficits are appreciated. No gait instability. Skin:  Skin is warm, dry and intact. No rash noted. Psychiatric: Mood and affect are normal. Speech and behavior are normal.  ____________________________________________   LABS (all labs ordered are listed, but only abnormal results are displayed)  Labs Reviewed - No data to display ____________________________________________  EKG   ____________________________________________  RADIOLOGY   ____________________________________________   PROCEDURES  Procedure(s) performed: None  Critical Care performed: No  ____________________________________________   INITIAL IMPRESSION / ASSESSMENT AND PLAN / ED COURSE  Pertinent labs & imaging results that were available during my care of the patient were reviewed by me and considered in my medical decision making (see chart for details).  Epistaxis left nostril. Hemorrhage control with Rapid Rhino insertion.  Patient advised to return tomorrow for removal, sooner if condition worsen. ____________________________________________   FINAL CLINICAL IMPRESSION(S) / ED DIAGNOSES  Final diagnoses:  Anterior epistaxis      Joni ReiningRonald K Tylar Merendino, PA-C 10/08/15 [id number]  Gayla DossEryka A Gayle, MD 10/08/15 1540

## 2015-10-08 NOTE — ED Notes (Signed)
NAD noted at time of D/C. Pt denies questions or concerns. Pt ambulatory to the lobby at this time.  

## 2017-05-17 ENCOUNTER — Encounter: Payer: Self-pay | Admitting: Emergency Medicine

## 2017-05-17 ENCOUNTER — Observation Stay: Payer: BLUE CROSS/BLUE SHIELD

## 2017-05-17 ENCOUNTER — Observation Stay
Admission: EM | Admit: 2017-05-17 | Discharge: 2017-05-18 | Disposition: A | Discharge status: Home / Self Care | Payer: BLUE CROSS/BLUE SHIELD | Attending: Internal Medicine | Admitting: Internal Medicine

## 2017-05-17 ENCOUNTER — Emergency Department: Payer: BLUE CROSS/BLUE SHIELD

## 2017-05-17 DIAGNOSIS — J9600 Acute respiratory failure, unspecified whether with hypoxia or hypercapnia: Secondary | ICD-10-CM | POA: Diagnosis present

## 2017-05-17 DIAGNOSIS — J9601 Acute respiratory failure with hypoxia: Principal | ICD-10-CM | POA: Insufficient documentation

## 2017-05-17 DIAGNOSIS — I10 Essential (primary) hypertension: Secondary | ICD-10-CM | POA: Diagnosis not present

## 2017-05-17 DIAGNOSIS — I2510 Atherosclerotic heart disease of native coronary artery without angina pectoris: Secondary | ICD-10-CM | POA: Insufficient documentation

## 2017-05-17 DIAGNOSIS — Z955 Presence of coronary angioplasty implant and graft: Secondary | ICD-10-CM | POA: Insufficient documentation

## 2017-05-17 DIAGNOSIS — E785 Hyperlipidemia, unspecified: Secondary | ICD-10-CM | POA: Insufficient documentation

## 2017-05-17 DIAGNOSIS — Z87891 Personal history of nicotine dependence: Secondary | ICD-10-CM | POA: Diagnosis not present

## 2017-05-17 DIAGNOSIS — Z7982 Long term (current) use of aspirin: Secondary | ICD-10-CM | POA: Insufficient documentation

## 2017-05-17 DIAGNOSIS — R0902 Hypoxemia: Secondary | ICD-10-CM

## 2017-05-17 DIAGNOSIS — Z7902 Long term (current) use of antithrombotics/antiplatelets: Secondary | ICD-10-CM | POA: Insufficient documentation

## 2017-05-17 DIAGNOSIS — J9691 Respiratory failure, unspecified with hypoxia: Secondary | ICD-10-CM | POA: Diagnosis present

## 2017-05-17 DIAGNOSIS — Z79899 Other long term (current) drug therapy: Secondary | ICD-10-CM | POA: Insufficient documentation

## 2017-05-17 DIAGNOSIS — I252 Old myocardial infarction: Secondary | ICD-10-CM | POA: Diagnosis not present

## 2017-05-17 LAB — CBC WITH DIFFERENTIAL/PLATELET
BASOS PCT: 1 %
Basophils Absolute: 0 10*3/uL (ref 0–0.1)
Eosinophils Absolute: 0.1 10*3/uL (ref 0–0.7)
Eosinophils Relative: 1 %
HEMATOCRIT: 40.1 % (ref 40.0–52.0)
Hemoglobin: 13.4 g/dL (ref 13.0–18.0)
LYMPHS PCT: 24 %
Lymphs Abs: 1.5 10*3/uL (ref 1.0–3.6)
MCH: 33.2 pg (ref 26.0–34.0)
MCHC: 33.5 g/dL (ref 32.0–36.0)
MCV: 99 fL (ref 80.0–100.0)
MONO ABS: 0.6 10*3/uL (ref 0.2–1.0)
MONOS PCT: 9 %
NEUTROS ABS: 4.1 10*3/uL (ref 1.4–6.5)
Neutrophils Relative %: 65 %
Platelets: 169 10*3/uL (ref 150–440)
RBC: 4.05 MIL/uL — ABNORMAL LOW (ref 4.40–5.90)
RDW: 16 % — ABNORMAL HIGH (ref 11.5–14.5)
WBC: 6.3 10*3/uL (ref 3.8–10.6)

## 2017-05-17 LAB — COMPREHENSIVE METABOLIC PANEL
ALT: 23 U/L (ref 17–63)
ANION GAP: 10 (ref 5–15)
AST: 32 U/L (ref 15–41)
Albumin: 3.9 g/dL (ref 3.5–5.0)
Alkaline Phosphatase: 84 U/L (ref 38–126)
BILIRUBIN TOTAL: 0.7 mg/dL (ref 0.3–1.2)
BUN: 14 mg/dL (ref 6–20)
CO2: 28 mmol/L (ref 22–32)
Calcium: 9.1 mg/dL (ref 8.9–10.3)
Chloride: 106 mmol/L (ref 101–111)
Creatinine, Ser: 1.37 mg/dL — ABNORMAL HIGH (ref 0.61–1.24)
GFR calc Af Amer: >60 mL/min (ref >60)
GFR, EST NON AFRICAN AMERICAN: 53 mL/min — AB (ref >60)
Glucose, Bld: 99 mg/dL (ref 65–99)
POTASSIUM: 3.4 mmol/L — AB (ref 3.5–5.1)
Sodium: 144 mmol/L (ref 135–145)
TOTAL PROTEIN: 7.5 g/dL (ref 6.5–8.1)

## 2017-05-17 LAB — FIBRIN DERIVATIVES D-DIMER (ARMC ONLY): FIBRIN DERIVATIVES D-DIMER (ARMC): 605.42 — AB (ref 0.00–499.00)

## 2017-05-17 MED ORDER — CLOPIDOGREL BISULFATE 75 MG PO TABS
75.0000 mg | ORAL_TABLET | Freq: Every day | ORAL | Status: DC
Start: 2017-05-17 — End: 2017-05-18
  Administered 2017-05-17 – 2017-05-18 (×2): 75 mg via ORAL
  Filled 2017-05-17 (×3): qty 1

## 2017-05-17 MED ORDER — ENOXAPARIN SODIUM 40 MG/0.4ML ~~LOC~~ SOLN
40.0000 mg | SUBCUTANEOUS | Status: DC
  Administered 2017-05-17: 40 mg via SUBCUTANEOUS
  Filled 2017-05-17: qty 0.4

## 2017-05-17 MED ORDER — FAMOTIDINE 20 MG PO TABS
20.0000 mg | ORAL_TABLET | Freq: Every day | ORAL | Status: DC
  Administered 2017-05-17 – 2017-05-18 (×2): 20 mg via ORAL
  Filled 2017-05-17 (×2): qty 1

## 2017-05-17 MED ORDER — IOPAMIDOL (ISOVUE-300) INJECTION 61%
75.0000 mL | Freq: Once | INTRAVENOUS | Status: AC | PRN
  Administered 2017-05-17: 75 mL via INTRAVENOUS

## 2017-05-17 MED ORDER — METOPROLOL SUCCINATE ER 50 MG PO TB24
100.0000 mg | ORAL_TABLET | Freq: Every day | ORAL | Status: DC
  Administered 2017-05-17 – 2017-05-18 (×2): 100 mg via ORAL
  Filled 2017-05-17 (×2): qty 2

## 2017-05-17 MED ORDER — ASPIRIN EC 81 MG PO TBEC
81.0000 mg | DELAYED_RELEASE_TABLET | Freq: Every day | ORAL | Status: DC
  Administered 2017-05-17 – 2017-05-18 (×2): 81 mg via ORAL
  Filled 2017-05-17 (×2): qty 1

## 2017-05-17 MED ORDER — ROSUVASTATIN CALCIUM 10 MG PO TABS
40.0000 mg | ORAL_TABLET | Freq: Every day | ORAL | Status: DC
  Administered 2017-05-17 – 2017-05-18 (×2): 40 mg via ORAL
  Filled 2017-05-17 (×2): qty 4

## 2017-05-17 MED ORDER — HYDROCHLOROTHIAZIDE 25 MG PO TABS
25.0000 mg | ORAL_TABLET | Freq: Every day | ORAL | Status: DC
  Administered 2017-05-17 – 2017-05-18 (×2): 25 mg via ORAL
  Filled 2017-05-17 (×2): qty 1

## 2017-05-17 MED ORDER — POLYSACCHARIDE IRON COMPLEX 150 MG PO CAPS
150.0000 mg | ORAL_CAPSULE | Freq: Every day | ORAL | Status: DC
Start: 2017-05-17 — End: 2017-05-18
  Administered 2017-05-18: 150 mg via ORAL
  Filled 2017-05-17 (×2): qty 1

## 2017-05-17 MED ORDER — ACETAMINOPHEN 650 MG RE SUPP: 650.0000 mg | Freq: Four times a day (QID) | RECTAL | Status: DC | PRN

## 2017-05-17 MED ORDER — ACETAMINOPHEN 325 MG PO TABS: 650.0000 mg | ORAL_TABLET | Freq: Four times a day (QID) | ORAL | Status: DC | PRN

## 2017-05-17 MED ORDER — IPRATROPIUM-ALBUTEROL 0.5-2.5 (3) MG/3ML IN SOLN
3.0000 mL | Freq: Four times a day (QID) | RESPIRATORY_TRACT | Status: DC
  Administered 2017-05-17 – 2017-05-18 (×2): 3 mL via RESPIRATORY_TRACT
  Filled 2017-05-17 (×2): qty 3

## 2017-05-17 MED ORDER — SODIUM CHLORIDE 0.9 % IV SOLN
INTRAVENOUS | Status: DC
  Administered 2017-05-17: 22:00:00 via INTRAVENOUS

## 2017-05-17 NOTE — ED Notes (Signed)
Pt transported to room 154 

## 2017-05-17 NOTE — ED Notes (Signed)
Per Dr. Scotty CourtStafford, no oxygen unless pt drops below 80s. Pt denies any pain. Not sob. No chest pain.

## 2017-05-17 NOTE — H&P (Signed)
Noah Dillon is an 61 y.o. male.   Chief Complaint: Hypoxia HPI: This is a 61 year old male who was over at his primary care doctor's office today for routine checkup and medicine refills. It was noted at his primary care doctor's office that his oxygen saturation was in the low 80s. He was sent over to the hospital for evaluation. He's had no symptoms of shortness of breath or cough. He does have a history of smoking but quit about 15 years ago however his wife smokes.  Past Medical History:  Diagnosis Date  . Hemorrhoids   . Hyperlipidemia   . Hypertension   . Myocardial infarction [facility])     Past Surgical History:  Procedure Laterality Date  . APPENDECTOMY    . COLONOSCOPY  2013   Dr. Sula Rumple  . heart stent  2008    No family history on file. Social History:  reports that he has quit smoking. His smoking use included Cigarettes. He has a 8.00 pack-year smoking history. He has never used smokeless tobacco. He reports that he drinks alcohol. He reports that he does not use drugs.  Allergies:  Allergies  Allergen Reactions  . Ace Inhibitors Swelling     (Not in a hospital admission)  Results for orders placed or performed during the hospital encounter of 05/17/17 (from the past 48 hour(s))  CBC with Differential     Status: Abnormal   Collection Time: 05/17/17  5:13 PM  Result Value Ref Range   WBC 6.3 3.8 - 10.6 K/uL   RBC 4.05 (L) 4.40 - 5.90 MIL/uL   Hemoglobin 13.4 13.0 - 18.0 g/dL   HCT 40.1 40.0 - 52.0 %   MCV 99.0 80.0 - 100.0 fL   MCH 33.2 26.0 - 34.0 pg   MCHC 33.5 32.0 - 36.0 g/dL   RDW 16.0 (H) 11.5 - 14.5 %   Platelets 169 150 - 440 K/uL   Neutrophils Relative % 65 %   Neutro Abs 4.1 1.4 - 6.5 K/uL   Lymphocytes Relative 24 %   Lymphs Abs 1.5 1.0 - 3.6 K/uL   Monocytes Relative 9 %   Monocytes Absolute 0.6 0.2 - 1.0 K/uL   Eosinophils Relative 1 %   Eosinophils Absolute 0.1 0 - 0.7 K/uL   Basophils Relative 1 %   Basophils Absolute 0.0 0 - 0.1 K/uL   Comprehensive metabolic panel     Status: Abnormal   Collection Time: 05/17/17  5:13 PM  Result Value Ref Range   Sodium 144 135 - 145 mmol/L   Potassium 3.4 (L) 3.5 - 5.1 mmol/L   Chloride 106 101 - 111 mmol/L   CO2 28 22 - 32 mmol/L   Glucose, Bld 99 65 - 99 mg/dL   BUN 14 6 - 20 mg/dL   Creatinine, Ser 1.37 (H) 0.61 - 1.24 mg/dL   Calcium 9.1 8.9 - 10.3 mg/dL   Total Protein 7.5 6.5 - 8.1 g/dL   Albumin 3.9 3.5 - 5.0 g/dL   AST 32 15 - 41 U/L   ALT 23 17 - 63 U/L   Alkaline Phosphatase 84 38 - 126 U/L   Total Bilirubin 0.7 0.3 - 1.2 mg/dL   GFR calc non Af Amer 53 (L) >60 mL/min   GFR calc Af Amer >60 >60 mL/min    Comment: (NOTE) The eGFR has been calculated using the CKD EPI equation. This calculation has not been validated in all clinical situations. eGFR's persistently <60 mL/min signify possible Chronic  Kidney Disease.    Anion gap 10 5 - 15  Fibrin derivatives D-Dimer     Status: Abnormal   Collection Time: 05/17/17  6:20 PM  Result Value Ref Range   Fibrin derivatives D-dimer ([facility]) 605.42 (H) 0.00 - 499.00    Comment: (NOTE) <> Exclusion of Venous Thromboembolism (VTE) - OUTPATIENT ONLY   (Emergency Department or [place])   0-499 ng/ml (FEU): With a low to intermediate pretest probability                      for VTE this test result excludes the diagnosis                      of VTE.   >499 ng/ml (FEU) : VTE not excluded; additional work up for VTE is                      required. <> Testing on Inpatients and Evaluation of Disseminated Intravascular   Coagulation (DIC) Reference Range:   0-499 ng/ml (FEU)    Dg Chest Portable 1 View  Result Date: 05/17/2017 CLINICAL DATA:  Low oxygen levels EXAM: PORTABLE CHEST 1 VIEW COMPARISON:  10/19/2009 FINDINGS: Emphysematous changes greatest in the upper lobes. Scarring and probable chronic interstitial change at the bases. No focal consolidation. Normal heart size. Aortic atherosclerosis. No pneumothorax. IMPRESSION:  1. Emphysematous disease within the upper lobes. 2. Scarring and probable mild fibrosis in the lung bases. No acute infiltrates are seen. Electronically Signed   By: Donavan Foil M.D.   On: 05/17/2017 17:39    Review of Systems  Constitutional: Negative for chills and fever.  HENT: Negative for hearing loss.   Eyes: Negative for blurred vision.  Respiratory: Negative for cough and shortness of breath.   Cardiovascular: Negative for chest pain.  Gastrointestinal: Negative for nausea and vomiting.  Genitourinary: Negative for dysuria.  Musculoskeletal: Negative for myalgias.  Skin: Negative for rash.  Neurological: Negative for dizziness.    Blood pressure (!) 163/84, pulse 98, temperature 98.8 F (37.1 C), temperature source Oral, resp. rate 11, SpO2 90 %. Physical Exam  Constitutional: He is oriented to person, place, and time. He appears well-developed and well-nourished.  HENT:  Head: Normocephalic and atraumatic.  Mouth/Throat: Oropharynx is clear and moist. No oropharyngeal exudate.  Eyes: Pupils are equal, round, and reactive to light. No scleral icterus.  Neck: Neck supple. No tracheal deviation present. No thyromegaly present.  Cardiovascular: Normal rate and regular rhythm.   No murmur heard. Respiratory: Breath sounds normal. No respiratory distress. He has no wheezes. He has no rales.  GI: Soft. Bowel sounds are normal. He exhibits no distension. There is no tenderness. There is no rebound and no guarding.  Musculoskeletal: He exhibits no edema.  Lymphadenopathy:    He has no cervical adenopathy.  Neurological: He is alert and oriented to person, place, and time. No cranial nerve deficit.  Skin: Skin is warm and dry.     Assessment/Plan 1. Acute respiratory failure. Patient was hypoxic at 86% on room air in the ER. He dropped down to 79%. He has no audible wheezing on exam or any other symptoms. Portable chest x-ray showed possible emphysematous changes in the upper  lobes and perhaps some fibrotic changes in the lower lobes. At this point we'll give continue him on supportive oxygen. Check a room air blood gas. Go ahead and get CT scan of his chest to further evaluate  the lung parenchyma and also rule out PE. We'll start him on some bronchodilator therapy. Try to wean the oxygen overnight. 2. Coronary artery disease. He has had a history of having stents placed. He's had no symptoms of chest pain. No exertional dyspnea. We'll continue his current medications  3. Hypertension. Continue home meds   4. Hyperlipidemia. He is Re: On a statin.  Total time spent 45 minutes  Baxter Hire, MD 05/17/2017, 7:32 PM

## 2017-05-17 NOTE — ED Provider Notes (Signed)
[facility] Emergency Department Provider Note  ____________________________________________  Time seen: Approximately 6:29 PM  I have reviewed the triage vital signs and the nursing notes.   HISTORY  Chief Complaint Hypoxia    HPI Noah Dillon is a 61 y.o. male sent to the ED by his primary care doctor due to a low oxygen saturation of 86% in the clinic. The patient was in his clinic today for routine follow-up for medication refill. Instead he was sent here. He denies any recent chest pain shortness of breath. No decrease in exercise tolerance and reports that he walked 2 miles yesterday without any problem. No exertional symptoms. No pain with deep breathing. No orthopnea or PND. No recent illness, no cough fever chills or sweats.  Reports he has been off of his medications for the last 3 days due to running out including metoprolol Plavix aspirin and Crestor   Past Medical History:  Diagnosis Date  . Hemorrhoids   . Hyperlipidemia   . Hypertension   . Myocardial infarction [facility](HCC)      Patient Active Problem List   Diagnosis Date Noted  . Internal hemorrhoid 06/03/2014     Past Surgical History:  Procedure Laterality Date  . APPENDECTOMY    . COLONOSCOPY  2013   Dr. Carollee MassedIftakar  . heart stent  2008     Prior to Admission medications   Medication Sig Start Date End Date Taking? Authorizing Provider  aspirin 81 MG tablet Take 81 mg by mouth daily.    [provider]  clopidogrel (PLAVIX) 75 MG tablet Take 75 mg by mouth daily.    [provider]  hydrochlorothiazide (HYDRODIURIL) 25 MG tablet Take 25 mg by mouth daily.    [provider]  iron polysaccharides (NIFEREX) 150 MG capsule Take 150 mg by mouth daily.    [provider]  metoprolol succinate (TOPROL-XL) 100 MG 24 hr tablet Take 100 mg by mouth daily. Take with or immediately following a meal.    [provider]  ranitidine (ZANTAC) 150 MG  capsule Take 150 mg by mouth 2 (two) times daily.    [provider]  rosuvastatin (CRESTOR) 40 MG tablet Take 40 mg by mouth daily.    [provider]     Allergies Ace inhibitors   No family history on file.  Social History Social History  Substance Use Topics  . Smoking status: Former Smoker    Packs/day: 1.00    Years: 8.00    Types: Cigarettes  . Smokeless tobacco: Never Used  . Alcohol use Yes    Review of Systems  Constitutional:   No fever or chills.  ENT:   No sore throat. No rhinorrhea. Cardiovascular:   No chest pain or syncope. Respiratory:   No dyspnea or cough. Gastrointestinal:   Negative for abdominal pain, vomiting and diarrhea.  Musculoskeletal:   Negative for focal pain or swelling All other systems reviewed and are negative except as documented above in ROS and HPI.  ____________________________________________   PHYSICAL EXAM:  VITAL SIGNS: ED Triage Vitals [05/17/17 1702]  Enc Vitals Group     BP (!) 176/79     Pulse Rate (!) 105     Resp 16     Temp 98.8 F (37.1 C)     Temp Source Oral     SpO2 (!) 86 %     Weight      Height      Head Circumference  Peak Flow      Pain Score      Pain Loc      Pain Edu?      Excl. in GC?     Vital signs reviewed, nursing assessments reviewed.   Constitutional:   Alert and oriented. Well appearing and in no distress. Eyes:   No scleral icterus.  EOMI. No nystagmus. No conjunctival pallor. PERRL. ENT   Head:   Normocephalic and atraumatic.   Nose:   No congestion/rhinnorhea.    Mouth/Throat:   MMM, no pharyngeal erythema. No peritonsillar mass.    Neck:   No meningismus. Full ROM Hematological/Lymphatic/Immunilogical:   No cervical lymphadenopathy. Cardiovascular:   RRR. Symmetric bilateral radial and DP pulses.  No murmurs.  Respiratory:   Normal respiratory effort without tachypnea/retractions. Breath sounds clear and equal bilaterally, bronchial breath  sounds diffusely. No wheezes or crackles. Gastrointestinal:   Soft and nontender. Non distended. There is no CVA tenderness.  No rebound, rigidity, or guarding. Genitourinary:   deferred Musculoskeletal:   Normal range of motion in all extremities. No joint effusions.  No lower extremity tenderness.  No edema. Neurologic:   Normal speech and language.  Motor grossly intact. No gross focal neurologic deficits are appreciated.  Skin:    Skin is warm, dry and intact. No rash noted.  No petechiae, purpura, or bullae.  ____________________________________________    LABS (pertinent positives/negatives) (all labs ordered are listed, but only abnormal results are displayed) Labs Reviewed  CBC WITH DIFFERENTIAL/PLATELET - Abnormal; Notable for the following:       Result Value   RBC 4.05 (*)    RDW 16.0 (*)    All other components within normal limits  COMPREHENSIVE METABOLIC PANEL - Abnormal; Notable for the following:    Potassium 3.4 (*)    Creatinine, Ser 1.37 (*)    GFR calc non Af Amer 53 (*)    All other components within normal limits  FIBRIN DERIVATIVES D-DIMER ([facility] ONLY)   ____________________________________________   EKG  EKG performed at PCPs office interpreted by me, sinus tachycardia rate 100, normal axis and intervals. Normal QRS ST segments and T waves. No right heart strain, no acute ischemic changes.  ____________________________________________    RADIOLOGY  Dg Chest Portable 1 View  Result Date: 05/17/2017 CLINICAL DATA:  Low oxygen levels EXAM: PORTABLE CHEST 1 VIEW COMPARISON:  10/19/2009 FINDINGS: Emphysematous changes greatest in the upper lobes. Scarring and probable chronic interstitial change at the bases. No focal consolidation. Normal heart size. Aortic atherosclerosis. No pneumothorax. IMPRESSION: 1. Emphysematous disease within the upper lobes. 2. Scarring and probable mild fibrosis in the lung bases. No acute infiltrates are seen. Electronically  Signed   By: Jasmine Pang M.D.   On: 05/17/2017 17:39    ____________________________________________   PROCEDURES Procedures  ____________________________________________   INITIAL IMPRESSION / ASSESSMENT AND PLAN / ED COURSE  Pertinent labs & imaging results that were available during my care of the patient were reviewed by me and considered in my medical decision making (see chart for details).    Clinical Course as of May 18 1831  Fri May 17, 2017  1738 Completely asymptomatic , so2 ranging 87-92%.  Check cxr, d dimer. No telling how long so2 has been at this level, with sx, stable for outpt follow up with pcp/pulm.   [PS]  1828 So2 now 79% RA. Still asymptomatic, but with severe hypoxia, warrants hospitalization for further eval. Pt on [place]. D dimer pending.  [PS]  Clinical Course User Index [PS] Sharman CheekStafford, Amarisa Wilinski, MD     ____________________________________________   FINAL CLINICAL IMPRESSION(S) / ED DIAGNOSES  Final diagnoses:  Respiratory failure with hypoxia, unspecified chronicity (HCC)      New Prescriptions   No medications on file     Portions of this note were generated with dragon dictation software. Dictation errors may occur despite best attempts at proofreading.    Sharman CheekStafford, [place] Abb[place], MD 05/17/17 [id number]

## 2017-05-17 NOTE — ED Triage Notes (Signed)
Pt send over by his MD for evaluation of low O2 levels. Pt states that he is not having any symptoms currently. Pt denies chest pain. Pt O2 sat in triage was 86% on room air. Pt was placed on 2 liters O2 via [place] with improvement in O2 sats to 93%. Pt denies hx/o COPD.

## 2017-05-18 LAB — BASIC METABOLIC PANEL
Anion gap: 7 (ref 5–15)
BUN: 14 mg/dL (ref 6–20)
CALCIUM: 9.1 mg/dL (ref 8.9–10.3)
CO2: 29 mmol/L (ref 22–32)
CREATININE: 1.35 mg/dL — AB (ref 0.61–1.24)
Chloride: 105 mmol/L (ref 101–111)
GFR calc Af Amer: >60 mL/min (ref >60)
GFR calc non Af Amer: 54 mL/min — ABNORMAL LOW (ref >60)
GLUCOSE: 90 mg/dL (ref 65–99)
Potassium: 3.6 mmol/L (ref 3.5–5.1)
Sodium: 141 mmol/L (ref 135–145)

## 2017-05-18 MED ORDER — IPRATROPIUM-ALBUTEROL 0.5-2.5 (3) MG/3ML IN SOLN: 3.0000 mL | Freq: Three times a day (TID) | RESPIRATORY_TRACT | Status: DC

## 2017-05-18 NOTE — Progress Notes (Signed)
Patient discharging home, Instructions given. Verbalized understanding. Family transporting home.

## 2017-05-18 NOTE — Discharge Summary (Signed)
[facility] [place] [facility]   PATIENT NAME: Noah Dillon    MR#:  [id number]  DATE OF BIRTH:  11/05/1955  DATE OF ADMISSION:  05/17/2017 ADMITTING PHYSICIAN: Gracelyn NurseJohn D Johnston, MD  DATE OF DISCHARGE: 05/18/2017  PRIMARY CARE PHYSICIAN: Sherron Mondayejan-Sie, S Ahmed, MD    ADMISSION DIAGNOSIS:  Respiratory failure with hypoxia, unspecified chronicity (HCC) [J96.91]  DISCHARGE DIAGNOSIS:  Active Problems:   Acute respiratory failure (HCC)   SECONDARY DIAGNOSIS:   Past Medical History:  Diagnosis Date  . Hemorrhoids   . Hyperlipidemia   . Hypertension   . Myocardial infarction [place](HCC)     HOSPITAL COURSE:   61 year old male with history of hypertension who presented from PCP office with hypoxia.   1. Acute hypoxic respiratory failure: Patient was initially 86% on room air. His CT scan showed no evidence of pulmonary emboli, pneumonia or pulmonary edema. He has some scarring. His oxygen currently is 92% on room air. Patient would benefit from outpatient pulmonary follow-up.  2. ASCVD: Continue aspirin and metoprolol and Plavix.  3. Hyperlipidemia: Continue Crestor  4. Essential hypertension: Continue HCTZ and metoprolol  DISCHARGE CONDITIONS AND DIET:   Stable for discharge on heart healthy diet  CONSULTS OBTAINED:    DRUG ALLERGIES:   Allergies  Allergen Reactions  . Ace Inhibitors Swelling    DISCHARGE MEDICATIONS:   Current Discharge Medication List    CONTINUE these medications which have NOT CHANGED   Details  aspirin 81 MG tablet Take 81 mg by mouth daily.    hydrochlorothiazide (HYDRODIURIL) 25 MG tablet Take 25 mg by mouth daily.    iron polysaccharides (NIFEREX) 150 MG capsule Take 150 mg by mouth daily.    metoprolol succinate (TOPROL-XL) 100 MG 24 hr tablet Take 100 mg by mouth daily. Take with or immediately following a meal.    ranitidine (ZANTAC) 150 MG capsule Take 150 mg by mouth 2 (two) times daily.    rosuvastatin  (CRESTOR) 40 MG tablet Take 40 mg by mouth daily.    clopidogrel (PLAVIX) 75 MG tablet Take 75 mg by mouth daily.          Today   CHIEF COMPLAINT:  Denies cough, shortness of breath, dyspnea exertion, chest pain, lower extremity edema. Feeling well and wants to go home.   VITAL SIGNS:  Blood pressure (!) 141/86, pulse 80, temperature 98.5 F (36.9 C), temperature source Oral, resp. rate 17, height 5\' 5"  (1.651 m), weight 79 kg (174 lb 1.6 oz), SpO2 92 %.   REVIEW OF SYSTEMS:  Review of Systems  Constitutional: Negative.  Negative for chills, fever and malaise/fatigue.  HENT: Negative.  Negative for ear discharge, ear pain, hearing loss, nosebleeds and sore throat.   Eyes: Negative.  Negative for blurred vision and pain.  Respiratory: Negative.  Negative for cough, hemoptysis, shortness of breath and wheezing.   Cardiovascular: Negative.  Negative for chest pain, palpitations and leg swelling.  Gastrointestinal: Negative.  Negative for abdominal pain, blood in stool, diarrhea, nausea and vomiting.  Genitourinary: Negative.  Negative for dysuria.  Musculoskeletal: Negative.  Negative for back pain.  Skin: Negative.   Neurological: Negative for dizziness, tremors, speech change, focal weakness, seizures and headaches.  Endo/Heme/Allergies: Negative.  Does not bruise/bleed easily.  Psychiatric/Behavioral: Negative.  Negative for depression, hallucinations and suicidal ideas.     PHYSICAL EXAMINATION:  GENERAL:  61 y.o.-year-old patient lying in the bed with no acute distress.  NECK:  Supple, no jugular venous distention. No  thyroid enlargement, no tenderness.  LUNGS: Normal breath sounds bilaterally, no wheezing, rales,rhonchi  No use of accessory muscles of respiration.  CARDIOVASCULAR: S1, S2 normal. No murmurs, rubs, or gallops.  ABDOMEN: Soft, non-tender, non-distended. Bowel sounds present. No organomegaly or mass.  EXTREMITIES: No pedal edema, cyanosis, or clubbing.   PSYCHIATRIC: The patient is alert and oriented x 3.  SKIN: No obvious rash, lesion, or ulcer.   DATA REVIEW:   CBC  Recent Labs Lab 05/17/17 1713  WBC 6.3  HGB 13.4  HCT 40.1  PLT 169    Chemistries   Recent Labs Lab 05/17/17 1713 05/18/17 0354  NA 144 141  K 3.4* 3.6  CL 106 105  CO2 28 29  GLUCOSE 99 90  BUN 14 14  CREATININE 1.37* 1.35*  CALCIUM 9.1 9.1  AST 32  --   ALT 23  --   ALKPHOS 84  --   BILITOT 0.7  --     Cardiac Enzymes No results for input(s): TROPONINI in the last 168 hours.  Microbiology Results  @[email or web address] @  RADIOLOGY:  Ct Chest W Contrast  Result Date: 05/17/2017 CLINICAL DATA:  Hypoxia EXAM: CT CHEST WITH CONTRAST TECHNIQUE: Multidetector CT imaging of the chest was performed during intravenous contrast administration. CONTRAST:  75mL ISOVUE-300 IOPAMIDOL (ISOVUE-300) INJECTION 61% COMPARISON:  Chest x-ray from earlier in the same day FINDINGS: Cardiovascular: Aortic atherosclerotic changes are noted without aneurysmal dilatation or dissection. No cardiac enlargement is noted. Heavy coronary calcifications are seen. Pulmonary artery shows no large central pulmonary embolus. Mediastinum/Nodes: The esophagus is within normal limits. Thoracic inlet is within normal limits. AP window lymph node is noted measuring 15 mm in short axis. Scattered small lymph nodes noted as well. No sizable hilar adenopathy is noted. Lungs/Pleura: Emphysematous changes are identified. No focal infiltrate or sizable effusion is seen. No pneumothorax is identified. Upper Abdomen: Diffusely fatty infiltrated liver is noted. The remainder of the upper abdomen is within normal limits. Musculoskeletal: Degenerative changes of thoracic spine. IMPRESSION: Prominent AP window lymph node. Chronic changes as described without acute abnormality. Aortic Atherosclerosis (ICD10-I70.0) and Emphysema (ICD10-J43.9). Electronically Signed   By: Alcide CleverMark  Lukens M.D.   On: 05/17/2017 21:31    Dg Chest Portable 1 View  Result Date: 05/17/2017 CLINICAL DATA:  Low oxygen levels EXAM: PORTABLE CHEST 1 VIEW COMPARISON:  10/19/2009 FINDINGS: Emphysematous changes greatest in the upper lobes. Scarring and probable chronic interstitial change at the bases. No focal consolidation. Normal heart size. Aortic atherosclerosis. No pneumothorax. IMPRESSION: 1. Emphysematous disease within the upper lobes. 2. Scarring and probable mild fibrosis in the lung bases. No acute infiltrates are seen. Electronically Signed   By: Jasmine PangKim  Fujinaga M.D.   On: 05/17/2017 17:39      Current Discharge Medication List    CONTINUE these medications which have NOT CHANGED   Details  aspirin 81 MG tablet Take 81 mg by mouth daily.    hydrochlorothiazide (HYDRODIURIL) 25 MG tablet Take 25 mg by mouth daily.    iron polysaccharides (NIFEREX) 150 MG capsule Take 150 mg by mouth daily.    metoprolol succinate (TOPROL-XL) 100 MG 24 hr tablet Take 100 mg by mouth daily. Take with or immediately following a meal.    ranitidine (ZANTAC) 150 MG capsule Take 150 mg by mouth 2 (two) times daily.    rosuvastatin (CRESTOR) 40 MG tablet Take 40 mg by mouth daily.    clopidogrel (PLAVIX) 75 MG tablet Take 75 mg by mouth daily.  Management plans discussed with the patient and he is in agreement. Stable for discharge home  Patient should follow up with pcp  CODE STATUS:     Code Status Orders        Start     Ordered   05/17/17 2037  Full code  Continuous     05/17/17 2036    Code Status History    Date Active Date Inactive Code Status Order ID Comments User Context   This patient has a current code status but no historical code status.      TOTAL TIME TAKING CARE OF THIS PATIENT: 37 minutes.    Note: This dictation was prepared with Dragon dictation along with smaller phrase technology. Any transcriptional errors that result from this process are unintentional.  Temekia Caskey M.D on  05/18/2017 at 11:01 AM  Between 7am to 6pm - Pager - [phone] After 6pm go to [email or web address] - password [email or web address]  [facility]  Office  [phone]  CC: Primary care physician; Sherron Monday, MD

## 2017-05-19 LAB — HIV ANTIBODY (ROUTINE TESTING W REFLEX): HIV SCREEN 4TH GENERATION: NONREACTIVE

## 2017-05-21 LAB — BLOOD GAS, ARTERIAL
Acid-Base Excess: 3 mmol/L — ABNORMAL HIGH (ref 0.0–2.0)
Bicarbonate: 27.1 mmol/L (ref 20.0–28.0)
FIO2: 0.21
O2 SAT: 90.2 %
Patient temperature: 37
pCO2 arterial: 39 mmHg (ref 32.0–48.0)
pH, Arterial: 7.45 (ref 7.350–7.450)
pO2, Arterial: 56 mmHg — ABNORMAL LOW (ref 83.0–108.0)

## 2021-03-06 ENCOUNTER — Emergency Department: Payer: Medicare Other

## 2021-03-06 ENCOUNTER — Other Ambulatory Visit: Payer: Self-pay

## 2021-03-06 ENCOUNTER — Inpatient Hospital Stay
Admission: EM | Admit: 2021-03-06 | Discharge: 2021-03-10 | DRG: 193 | Disposition: A | Discharge status: Home / Self Care | Payer: Medicare Other | Attending: Internal Medicine | Admitting: Internal Medicine

## 2021-03-06 DIAGNOSIS — Z955 Presence of coronary angioplasty implant and graft: Secondary | ICD-10-CM

## 2021-03-06 DIAGNOSIS — J189 Pneumonia, unspecified organism: Secondary | ICD-10-CM | POA: Diagnosis present

## 2021-03-06 DIAGNOSIS — Z888 Allergy status to other drugs, medicaments and biological substances status: Secondary | ICD-10-CM | POA: Diagnosis not present

## 2021-03-06 DIAGNOSIS — J9601 Acute respiratory failure with hypoxia: Secondary | ICD-10-CM | POA: Diagnosis present

## 2021-03-06 DIAGNOSIS — I10 Essential (primary) hypertension: Secondary | ICD-10-CM | POA: Diagnosis present

## 2021-03-06 DIAGNOSIS — E785 Hyperlipidemia, unspecified: Secondary | ICD-10-CM | POA: Diagnosis present

## 2021-03-06 DIAGNOSIS — N179 Acute kidney failure, unspecified: Secondary | ICD-10-CM | POA: Diagnosis present

## 2021-03-06 DIAGNOSIS — Z20822 Contact with and (suspected) exposure to covid-19: Secondary | ICD-10-CM | POA: Diagnosis present

## 2021-03-06 DIAGNOSIS — Z9049 Acquired absence of other specified parts of digestive tract: Secondary | ICD-10-CM | POA: Diagnosis not present

## 2021-03-06 DIAGNOSIS — Z9889 Other specified postprocedural states: Secondary | ICD-10-CM

## 2021-03-06 DIAGNOSIS — I2510 Atherosclerotic heart disease of native coronary artery without angina pectoris: Secondary | ICD-10-CM | POA: Diagnosis present

## 2021-03-06 DIAGNOSIS — I7410 Embolism and thrombosis of unspecified parts of aorta: Secondary | ICD-10-CM | POA: Diagnosis present

## 2021-03-06 DIAGNOSIS — I252 Old myocardial infarction: Secondary | ICD-10-CM | POA: Diagnosis not present

## 2021-03-06 DIAGNOSIS — J440 Chronic obstructive pulmonary disease with acute lower respiratory infection: Secondary | ICD-10-CM | POA: Diagnosis present

## 2021-03-06 DIAGNOSIS — Z23 Encounter for immunization: Secondary | ICD-10-CM | POA: Diagnosis present

## 2021-03-06 DIAGNOSIS — Z87891 Personal history of nicotine dependence: Secondary | ICD-10-CM | POA: Diagnosis not present

## 2021-03-06 DIAGNOSIS — R0600 Dyspnea, unspecified: Secondary | ICD-10-CM

## 2021-03-06 DIAGNOSIS — Z7901 Long term (current) use of anticoagulants: Secondary | ICD-10-CM | POA: Diagnosis not present

## 2021-03-06 DIAGNOSIS — I248 Other forms of acute ischemic heart disease: Secondary | ICD-10-CM | POA: Diagnosis not present

## 2021-03-06 DIAGNOSIS — J90 Pleural effusion, not elsewhere classified: Secondary | ICD-10-CM

## 2021-03-06 DIAGNOSIS — E876 Hypokalemia: Secondary | ICD-10-CM | POA: Diagnosis present

## 2021-03-06 DIAGNOSIS — Z79899 Other long term (current) drug therapy: Secondary | ICD-10-CM | POA: Diagnosis not present

## 2021-03-06 LAB — RESP PANEL BY RT-PCR (FLU A&B, COVID) ARPGX2
Influenza A by PCR: NEGATIVE
Influenza B by PCR: NEGATIVE
SARS Coronavirus 2 by RT PCR: NEGATIVE

## 2021-03-06 LAB — BASIC METABOLIC PANEL
Anion gap: 13 (ref 5–15)
BUN: 20 mg/dL (ref 8–23)
CO2: 27 mmol/L (ref 22–32)
Calcium: 8.2 mg/dL — ABNORMAL LOW (ref 8.9–10.3)
Chloride: 97 mmol/L — ABNORMAL LOW (ref 98–111)
Creatinine, Ser: 1.7 mg/dL — ABNORMAL HIGH (ref 0.61–1.24)
GFR, Estimated: 44 mL/min — ABNORMAL LOW (ref >60)
Glucose, Bld: 108 mg/dL — ABNORMAL HIGH (ref 70–99)
Potassium: 2.4 mmol/L — CL (ref 3.5–5.1)
Sodium: 137 mmol/L (ref 135–145)

## 2021-03-06 LAB — PROTIME-INR
INR: 1.7 — ABNORMAL HIGH (ref 0.8–1.2)
Prothrombin Time: 20.4 seconds — ABNORMAL HIGH (ref 11.4–15.2)

## 2021-03-06 LAB — CBC
HCT: 37 % — ABNORMAL LOW (ref 39.0–52.0)
Hemoglobin: 11.2 g/dL — ABNORMAL LOW (ref 13.0–17.0)
MCH: 29.6 pg (ref 26.0–34.0)
MCHC: 30.3 g/dL (ref 30.0–36.0)
MCV: 97.9 fL (ref 80.0–100.0)
Platelets: 252 10*3/uL (ref 150–400)
RBC: 3.78 MIL/uL — ABNORMAL LOW (ref 4.22–5.81)
RDW: 19.4 % — ABNORMAL HIGH (ref 11.5–15.5)
WBC: 6.7 10*3/uL (ref 4.0–10.5)
nRBC: 0.9 % — ABNORMAL HIGH (ref 0.0–0.2)

## 2021-03-06 LAB — TROPONIN I (HIGH SENSITIVITY)
Troponin I (High Sensitivity): 147 ng/L (ref <18)
Troponin I (High Sensitivity): 109 ng/L (ref <18)

## 2021-03-06 LAB — TSH: TSH: 0.837 u[IU]/mL (ref 0.350–4.500)

## 2021-03-06 LAB — BRAIN NATRIURETIC PEPTIDE: B Natriuretic Peptide: 671 pg/mL — ABNORMAL HIGH (ref 0.0–100.0)

## 2021-03-06 MED ORDER — SODIUM CHLORIDE 0.9 % IV SOLN
1.0000 g | Freq: Once | INTRAVENOUS | Status: AC
  Administered 2021-03-06: 1 g via INTRAVENOUS
  Filled 2021-03-06: qty 10

## 2021-03-06 MED ORDER — WARFARIN SODIUM 4 MG PO TABS
4.0000 mg | ORAL_TABLET | ORAL | Status: DC
  Filled 2021-03-06: qty 1

## 2021-03-06 MED ORDER — ACETAMINOPHEN 325 MG PO TABS: 650.0000 mg | ORAL_TABLET | Freq: Four times a day (QID) | ORAL | Status: DC | PRN

## 2021-03-06 MED ORDER — CLOPIDOGREL BISULFATE 75 MG PO TABS
75.0000 mg | ORAL_TABLET | Freq: Every day | ORAL | Status: DC
  Administered 2021-03-06 – 2021-03-10 (×5): 75 mg via ORAL
  Filled 2021-03-06 (×5): qty 1

## 2021-03-06 MED ORDER — CELECOXIB 200 MG PO CAPS
200.0000 mg | ORAL_CAPSULE | Freq: Every morning | ORAL | Status: DC
  Administered 2021-03-07 – 2021-03-10 (×4): 200 mg via ORAL
  Filled 2021-03-06 (×4): qty 1

## 2021-03-06 MED ORDER — FAMOTIDINE 20 MG PO TABS: 40.0000 mg | ORAL_TABLET | Freq: Every day | ORAL | Status: DC

## 2021-03-06 MED ORDER — PNEUMOCOCCAL VAC POLYVALENT 25 MCG/0.5ML IJ INJ
0.5000 mL | INJECTION | INTRAMUSCULAR | Status: AC
  Administered 2021-03-07: 09:00:00 0.5 mL via INTRAMUSCULAR
  Filled 2021-03-06: qty 0.5

## 2021-03-06 MED ORDER — AMLODIPINE BESYLATE 10 MG PO TABS
10.0000 mg | ORAL_TABLET | Freq: Every day | ORAL | Status: DC
  Administered 2021-03-07: 10 mg via ORAL
  Filled 2021-03-06: qty 1

## 2021-03-06 MED ORDER — ASPIRIN 81 MG PO CHEW
81.0000 mg | CHEWABLE_TABLET | Freq: Every day | ORAL | Status: DC
  Administered 2021-03-06 – 2021-03-10 (×5): 81 mg via ORAL
  Filled 2021-03-06 (×5): qty 1

## 2021-03-06 MED ORDER — PANTOPRAZOLE SODIUM 40 MG PO TBEC
40.0000 mg | DELAYED_RELEASE_TABLET | Freq: Every morning | ORAL | Status: DC
  Administered 2021-03-07 – 2021-03-10 (×4): 40 mg via ORAL
  Filled 2021-03-06 (×3): qty 1

## 2021-03-06 MED ORDER — IPRATROPIUM-ALBUTEROL 0.5-2.5 (3) MG/3ML IN SOLN
3.0000 mL | Freq: Once | RESPIRATORY_TRACT | Status: AC
  Administered 2021-03-06: 3 mL via RESPIRATORY_TRACT
  Filled 2021-03-06: qty 3

## 2021-03-06 MED ORDER — POTASSIUM CHLORIDE IN NACL 20-0.9 MEQ/L-% IV SOLN
INTRAVENOUS | Status: DC
  Filled 2021-03-06 (×2): qty 1000

## 2021-03-06 MED ORDER — HEPARIN SODIUM (PORCINE) 5000 UNIT/ML IJ SOLN
5000.0000 [IU] | Freq: Three times a day (TID) | INTRAMUSCULAR | Status: DC
  Administered 2021-03-06 – 2021-03-07 (×2): 5000 [IU] via SUBCUTANEOUS
  Filled 2021-03-06 (×2): qty 1

## 2021-03-06 MED ORDER — ROSUVASTATIN CALCIUM 20 MG PO TABS
40.0000 mg | ORAL_TABLET | Freq: Every day | ORAL | Status: DC
  Administered 2021-03-07 – 2021-03-10 (×4): 40 mg via ORAL
  Filled 2021-03-06 (×4): qty 2

## 2021-03-06 MED ORDER — ASPIRIN 81 MG PO CHEW
81.0000 mg | CHEWABLE_TABLET | Freq: Once | ORAL | Status: AC
  Administered 2021-03-06: 81 mg via ORAL
  Filled 2021-03-06: qty 1

## 2021-03-06 MED ORDER — ALBUTEROL SULFATE (2.5 MG/3ML) 0.083% IN NEBU: 2.5000 mg | INHALATION_SOLUTION | Freq: Four times a day (QID) | RESPIRATORY_TRACT | Status: DC | PRN

## 2021-03-06 MED ORDER — SODIUM CHLORIDE 0.9 % IV SOLN: INTRAVENOUS | Status: DC

## 2021-03-06 MED ORDER — WARFARIN SODIUM 4 MG PO TABS
4.0000 mg | ORAL_TABLET | ORAL | Status: DC
  Administered 2021-03-07: 4 mg via ORAL
  Filled 2021-03-06: qty 1

## 2021-03-06 MED ORDER — POTASSIUM CHLORIDE CRYS ER 20 MEQ PO TBCR
40.0000 meq | EXTENDED_RELEASE_TABLET | Freq: Once | ORAL | Status: AC
  Administered 2021-03-06: 40 meq via ORAL
  Filled 2021-03-06: qty 2

## 2021-03-06 MED ORDER — IOHEXOL 350 MG/ML SOLN
75.0000 mL | Freq: Once | INTRAVENOUS | Status: AC | PRN
  Administered 2021-03-06: 75 mL via INTRAVENOUS

## 2021-03-06 MED ORDER — MAGNESIUM SULFATE 2 GM/50ML IV SOLN
2.0000 g | Freq: Once | INTRAVENOUS | Status: AC
  Administered 2021-03-06: 2 g via INTRAVENOUS
  Filled 2021-03-06: qty 50

## 2021-03-06 MED ORDER — EZETIMIBE 10 MG PO TABS
10.0000 mg | ORAL_TABLET | Freq: Every day | ORAL | Status: DC
  Administered 2021-03-07 – 2021-03-10 (×4): 10 mg via ORAL
  Filled 2021-03-06 (×4): qty 1

## 2021-03-06 MED ORDER — SODIUM CHLORIDE 0.9 % IV SOLN
500.0000 mg | Freq: Once | INTRAVENOUS | Status: AC
  Administered 2021-03-06: 500 mg via INTRAVENOUS
  Filled 2021-03-06: qty 500

## 2021-03-06 MED ORDER — METOPROLOL SUCCINATE ER 50 MG PO TB24
100.0000 mg | ORAL_TABLET | Freq: Every day | ORAL | Status: DC
  Administered 2021-03-07 – 2021-03-10 (×4): 100 mg via ORAL
  Filled 2021-03-06 (×4): qty 2

## 2021-03-06 MED ORDER — WARFARIN - PHYSICIAN DOSING INPATIENT
Freq: Every day | Status: DC
  Filled 2021-03-06: qty 1

## 2021-03-06 MED ORDER — LORATADINE 10 MG PO TABS
10.0000 mg | ORAL_TABLET | Freq: Every day | ORAL | Status: DC
  Administered 2021-03-07 – 2021-03-10 (×4): 10 mg via ORAL
  Filled 2021-03-06 (×4): qty 1

## 2021-03-06 MED ORDER — FUROSEMIDE 10 MG/ML IJ SOLN
40.0000 mg | Freq: Once | INTRAMUSCULAR | Status: AC
  Administered 2021-03-06: 40 mg via INTRAVENOUS

## 2021-03-06 MED ORDER — WARFARIN SODIUM 3 MG PO TABS
3.0000 mg | ORAL_TABLET | ORAL | Status: DC
  Administered 2021-03-06: 3 mg via ORAL
  Filled 2021-03-06: qty 1

## 2021-03-06 MED ORDER — METHYLPREDNISOLONE SODIUM SUCC 125 MG IJ SOLR
125.0000 mg | Freq: Once | INTRAMUSCULAR | Status: AC
  Administered 2021-03-06: 125 mg via INTRAVENOUS
  Filled 2021-03-06: qty 2

## 2021-03-06 MED ORDER — ACETAMINOPHEN 650 MG RE SUPP: 650.0000 mg | Freq: Four times a day (QID) | RECTAL | Status: DC | PRN

## 2021-03-06 MED ORDER — FLUTICASONE PROPIONATE 50 MCG/ACT NA SUSP
1.0000 | Freq: Every day | NASAL | Status: DC
  Administered 2021-03-07 – 2021-03-10 (×4): 1 via NASAL
  Filled 2021-03-06: qty 16

## 2021-03-06 NOTE — ED Provider Notes (Signed)
[facility] Emergency Department Provider Note  ____________________________________________   Event Date/Time   First MD Initiated Contact with Patient 03/06/21 1812     (approximate)  I have reviewed the triage vital signs and the nursing notes.   HISTORY  Chief Complaint Abnormal Lab   HPI Noah Dillon is a 65 y.o. male who presents to the emergency department for evaluation of feeling weak and nauseous since Friday.  He states that he went to his PCP at [facility] on Friday, where they obtained blood work.  He states that he overall is feeling a little bit better today but that they called him to inform him that his potassium was 2.3 and advised him to come to the emergency department.  He denies any chest pain, shortness of breath, cough, recent sick contacts.  Overall, his primary complaint is continued weakness.  He denies any nausea vomiting, diarrhea, abdominal pain or other symptoms.  He does endorse history of MI with stent placement in approximately 2008.         Past Medical History:  Diagnosis Date  . Hemorrhoids   . Hyperlipidemia   . Hypertension   . Myocardial infarction [facility](HCC)     Patient Active Problem List   Diagnosis Date Noted  . Hypokalemia due to loss of potassium 03/06/2021  . Acute respiratory failure (HCC) 05/17/2017  . Internal hemorrhoid 06/03/2014    Past Surgical History:  Procedure Laterality Date  . APPENDECTOMY    . COLONOSCOPY  2013   Dr. Carollee MassedIftakar  . heart stent  2008    Prior to Admission medications   Medication Sig Start Date End Date Taking? Authorizing Provider  amLODipine (NORVASC) 10 MG tablet Take 10 mg by mouth daily.   Yes [provider]  celecoxib (CELEBREX) 200 MG capsule Take 200 mg by mouth every morning.   Yes [provider]  ezetimibe (ZETIA) 10 MG tablet Take 10 mg by mouth daily.   Yes [provider]  fluticasone (FLONASE) 50 MCG/ACT nasal spray Place  1 spray into both nostrils daily.   Yes [provider]  loratadine (CLARITIN) 10 MG tablet Take 10 mg by mouth daily. 01/26/21  Yes [provider]  metoprolol succinate (TOPROL-XL) 100 MG 24 hr tablet Take 200 mg by mouth daily.   Yes [provider]  pantoprazole (PROTONIX) 40 MG tablet Take 40 mg by mouth every morning.   Yes [provider]  rosuvastatin (CRESTOR) 40 MG tablet Take 40 mg by mouth daily.   Yes [provider]  warfarin (COUMADIN) 3 MG tablet Take 3 mg by mouth every Monday, Wednesday, and Friday.   Yes [provider]  warfarin (COUMADIN) 4 MG tablet Take 4 mg by mouth See admin instructions. Take 1 tablet (4mg ) by mouth every Sunday, Tuesday, Thursday and Saturday   Yes [provider]    Allergies Ace inhibitors  No family history on file.  Social History Social History   Tobacco Use  . Smoking status: Former Smoker    Packs/day: 1.00    Years: 8.00    Pack years: 8.00    Types: Cigarettes  . Smokeless tobacco: Never Used  Substance Use Topics  . Alcohol use: Yes  . Drug use: No    Review of Systems Constitutional: + Weakness, no fever/chills Eyes: No visual changes. ENT: No sore throat. Cardiovascular: Denies chest pain. Respiratory: Denies shortness of breath. Gastrointestinal: No abdominal pain.  + nausea, no vomiting.  No diarrhea.  No constipation. Genitourinary: Negative for dysuria. Musculoskeletal: Negative for back pain. Skin: Negative for rash. Neurological: Negative for headaches, focal weakness or numbness.   ____________________________________________   PHYSICAL EXAM:  VITAL SIGNS: ED Triage Vitals  Enc Vitals Group     BP 03/06/21 1343 115/61     Pulse Rate 03/06/21 1343 89     Resp 03/06/21 1343 17     Temp 03/06/21 1343 98.2 F (36.8 C)     Temp Source 03/06/21 1343 Oral     SpO2 03/06/21 1343 (S) (!) 80 %     Weight 03/06/21 1347 150 lb (68 kg)     Height  03/06/21 1347 5\' 5"  (1.651 m)     Head Circumference --      Peak Flow --      Pain Score 03/06/21 1344 0     Pain Loc --      Pain Edu? --      Excl. in GC? --    Constitutional: Alert and oriented. Well appearing and in no acute distress. Eyes: Conjunctivae are normal. PERRL. EOMI. Head: Atraumatic. Nose: No congestion/rhinnorhea. Mouth/Throat: Mucous membranes are moist.  Oropharynx non-erythematous. Neck: No stridor.   Cardiovascular: Normal rate, regular rhythm. Grossly normal heart sounds.  Good peripheral circulation. Respiratory: Normal respiratory effort.  No retractions. Lungs with decreased breath sounds in the bilateral lower lobes, changed to bilateral rhonchi after 1 DuoNeb treatment. Gastrointestinal: Soft and nontender. No distention. No abdominal bruits. No CVA tenderness. Musculoskeletal: No lower extremity tenderness nor edema.  No joint effusions. Neurologic:  Normal speech and language. No gross focal neurologic deficits are appreciated. No gait instability. Skin:  Skin is warm, dry and intact. No rash noted. Psychiatric: Mood and affect are normal. Speech and behavior are normal.  ____________________________________________   LABS (all labs ordered are listed, but only abnormal results are displayed)  Labs Reviewed  BASIC METABOLIC PANEL - Abnormal; Notable for the following components:      Result Value   Potassium 2.4 (*)    Chloride 97 (*)    Glucose, Bld 108 (*)    Creatinine, Ser 1.70 (*)    Calcium 8.2 (*)    GFR, Estimated 44 (*)    All other components within normal limits  CBC - Abnormal; Notable for the following components:   RBC 3.78 (*)    Hemoglobin 11.2 (*)    HCT 37.0 (*)    RDW 19.4 (*)    nRBC 0.9 (*)    All other components within normal limits  BRAIN NATRIURETIC PEPTIDE - Abnormal; Notable for the following components:   B Natriuretic Peptide 671.0 (*)    All other components within normal limits  TROPONIN I (HIGH SENSITIVITY)  - Abnormal; Notable for the following components:   Troponin I (High Sensitivity) 147 (*)    All other components within normal limits  TROPONIN I (HIGH SENSITIVITY) - Abnormal; Notable for the following components:   Troponin I (High Sensitivity) 109 (*)    All other components within normal limits  RESP PANEL BY RT-PCR (FLU A&B, COVID) ARPGX2  HIV ANTIBODY (ROUTINE TESTING W REFLEX)  TSH  BASIC METABOLIC PANEL  CBC  PROTIME-INR  PROTIME-INR  MAGNESIUM   ____________________________________________  EKG  Normal sinus rhythm with a rate of 98 bpm, prolonged QTC at 581.  No ST elevation or depression to suggest acute ischemia. ____________________________________________  RADIOLOGY 03/08/21, personally viewed and evaluated these images (plain radiographs) as  part of my medical decision making, as well as reviewing the written report by the radiologist.  ED provider interpretation: Chest x-ray reveals possible left lower infiltrate, see radiology report for CT findings  Official radiology report(s): DG Chest 2 View  Result Date: 03/06/2021 CLINICAL DATA:  Shortness of breath EXAM: CHEST - 2 VIEW COMPARISON:  05/17/2017 FINDINGS: The heart size and mediastinal contours are within normal limits. Atherosclerotic calcification of the aortic knob. Advanced emphysematous changes within the upper lung fields. Small left pleural effusion with streaky left basilar opacity. Linear opacities in the right lung base which may reflect scarring or atelectasis. No pneumothorax. The visualized skeletal structures are unremarkable. IMPRESSION: 1. Small left pleural effusion with streaky left basilar opacity, which may reflect atelectasis versus infiltrate. 2. Advanced emphysematous changes within the upper lung fields. Electronically Signed   By: Duanne Guess D.O.   On: 03/06/2021 14:52   CT Angio Chest PE W and/or Wo Contrast  Result Date: 03/06/2021 CLINICAL DATA:  Decreased potassium  level and shortness of breath EXAM: CT ANGIOGRAPHY CHEST WITH CONTRAST TECHNIQUE: Multidetector CT imaging of the chest was performed using the standard protocol during bolus administration of intravenous contrast. Multiplanar CT image reconstructions and MIPs were obtained to evaluate the vascular anatomy. CONTRAST:  14mL OMNIPAQUE IOHEXOL 350 MG/ML SOLN COMPARISON:  05/17/2017 FINDINGS: Cardiovascular: Thoracic aorta and its branches demonstrate atherosclerotic calcification without aneurysmal dilatation or dissection. Coronary calcifications are noted. Heart is mildly enlarged in size. Pulmonary artery shows a normal branching pattern. No definitive filling defect is identified. Mediastinum/Nodes: Thoracic inlet is within normal limits. No sizable hilar or mediastinal adenopathy is noted. The esophagus as visualized is within normal limits. Lungs/Pleura: Bilateral pleural effusions are noted of a moderate degree with bilateral lower lobe consolidation. Diffuse emphysematous changes are identified in both lungs worst in the apices. No focal pulmonary nodule is seen. Upper Abdomen: Visualized upper abdomen is within normal limits. Musculoskeletal: Old rib fractures are noted on the left. No acute bony abnormality is noted. Review of the MIP images confirms the above findings. IMPRESSION: No evidence of pulmonary emboli. Bilateral pleural effusions with lower lobe consolidation. Chronic emphysematous changes are again seen. Aortic Atherosclerosis (ICD10-I70.0) and Emphysema (ICD10-J43.9). Electronically Signed   By: Alcide Clever M.D.   On: 03/06/2021 17:08    ____________________________________________   INITIAL IMPRESSION / ASSESSMENT AND PLAN / ED COURSE  As part of my medical decision making, I reviewed the following data within the electronic MEDICAL RECORD NUMBER Nursing notes reviewed and incorporated, Labs reviewed, Radiograph reviewed, Discussed with admitting physician and Notes from prior ED visits         Patient is a 65 year old male who presents to the emergency department for evaluation of weakness and nausea after being told by his primary care that his potassium was significantly low on Friday.  When he arrived in triage, the patient was noted to be satting at 78 to 84% on room air.  He continued to deny any chest pain or shortness of breath.  He was placed on 3 L by nasal cannula and improved to 97%.  On physical exam, the patient initially did not have significant breath sounds in the bilateral lower fields, however this changed to bilateral rhonchi after 1 DuoNeb treatment and steroid.  He does not have any significant bilateral pitting edema, heart sounds are grossly normal.  CBC demonstrates a mild hemoglobin anemia of 11.2, BMP demonstrates potassium of 2.4, calcium 8.2 with a creatinine of 1.7 and  GFR 44.  No comparison labs are available within the last 2 to 3 years, however this does look like a change in kidney function from 3 years ago.  Patient is initial troponin was 144, however after oxygenation with 3 L by nasal cannula, improved to 109.  This likely represents demand ischemia.  Respiratory panel was negative for COVID or flu.  His BNP was obtained and elevated at 671 and he denies any known history of CHF.  Chest x-ray is concerning for possible left lower infiltrate, however the the patient's hypoxia on arrival, CT was obtained to further characterize and does demonstrate bilateral lower lobe pneumonia with pleural effusion.  Antibiotics were initiated.  Case was discussed with hospitalist who accepts the patient for admission.  Patient is stable this time for admission to the floor.      ____________________________________________   FINAL CLINICAL IMPRESSION(S) / ED DIAGNOSES  Final diagnoses:  None     ED Discharge Orders    None      *Please note:  Andriel N Fundora was evaluated in Emergency Department on 03/06/2021 for the symptoms described in the history of  present illness. He was evaluated in the context of the global COVID-19 pandemic, which necessitated consideration that the patient might be at risk for infection with the SARS-CoV-2 virus that causes COVID-19. Institutional protocols and algorithms that pertain to the evaluation of patients at risk for COVID-19 are in a state of rapid change based on information released by regulatory bodies including the CDC and federal and state organizations. These policies and algorithms were followed during the patient's care in the ED.  Some ED evaluations and interventions may be delayed as a result of limited staffing during and the pandemic.*   Note:  This document was prepared using Dragon voice recognition software and may include unintentional dictation errors.   Lucy Chris, PA 03/06/21 Rushie Goltz    Sharman Cheek, MD 03/08/21 [phone]

## 2021-03-06 NOTE — H&P (Addendum)
.   Chief Complaint: Patient was referred patient was referred to the ED by primary care physician on account of abnormal labs HPI:  Noah Dillon is an 65 y.o. male with medical history significant for hypertension, hyperlipidemia, coronary disease status post stent 5 years ago.  He is also previous tobacco smoker quit several years ago.  Patient apparently had been feeling unwell, went to see PCP on Friday, blood were drawn and results called in today with consent for abnormal labs.  Patient was asked to come to the emergency room to be further evaluated.  In the ED, repeat labs revealed evidence of hypokalemia of 2.4.  Patient denied any prior history of hypokalemia.  He admitted to poor oral intake but clinically has been feeling better over the weekend.  His med rec includes hydrochlorothiazide, which he has been taking for a while for treatment of HTN.  Denies any coughing spells.  Denies any shortness of breath.  In the ED however, patient was reported to be hypoxic requiring oxygen supplementation.  CT angiogram was done revealed emphysematous changes in the lungs.  No obvious infiltrates however noted.  Patient states this is his baseline and denies any symptoms.  Past Medical History:  Diagnosis Date  . Hemorrhoids   . Hyperlipidemia   . Hypertension   . Myocardial infarction [facility])     Past Surgical History:  Procedure Laterality Date  . APPENDECTOMY    . COLONOSCOPY  2013   Dr. Carollee Massed  . heart stent  2008    No family history on file. Social History:  reports that he has quit smoking. His smoking use included cigarettes. He has a 8.00 pack-year smoking history. He has never used smokeless tobacco. He reports current alcohol use. He reports that he does not use drugs.  Allergies:  Allergies  Allergen Reactions  . Ace Inhibitors Swelling    (Not in a hospital admission)   Results for orders placed or performed during the hospital encounter of 03/06/21 (from the past 48  hour(s))  Basic metabolic panel     Status: Abnormal   Collection Time: 03/06/21  1:50 PM  Result Value Ref Range   Sodium 137 135 - 145 mmol/L   Potassium 2.4 (LL) 3.5 - 5.1 mmol/L    Comment: CRITICAL RESULT CALLED TO, READ BACK BY AND VERIFIED WITH JOHN VANDRUFF @1543  ON 03/06/21 SKL    Chloride 97 (L) 98 - 111 mmol/L   CO2 27 22 - 32 mmol/L   Glucose, Bld 108 (H) 70 - 99 mg/dL    Comment: Glucose reference range applies only to samples taken after fasting for at least 8 hours.   BUN 20 8 - 23 mg/dL   Creatinine, Ser 03/08/21 (H) 0.61 - 1.24 mg/dL   Calcium 8.2 (L) 8.9 - 10.3 mg/dL   GFR, Estimated 44 (L) >60 mL/min    Comment: (NOTE) Calculated using the CKD-EPI Creatinine Equation (2021)    Anion gap 13 5 - 15    Comment: Performed at [facility], [address] [place], [place]  CBC     Status: Abnormal   Collection Time: 03/06/21  1:50 PM  Result Value Ref Range   WBC 6.7 4.0 - 10.5 K/uL   RBC 3.78 (L) 4.22 - 5.81 MIL/uL   Hemoglobin 11.2 (L) 13.0 - 17.0 g/dL   HCT 03/08/21 (L) 15.7 - 26.2 %   MCV 97.9 80.0 - 100.0 fL   MCH 29.6 26.0 - 34.0 pg  MCHC 30.3 30.0 - 36.0 g/dL   RDW 16.119.4 (H) 09.611.5 - 04.515.5 %   Platelets 252 150 - 400 K/uL   nRBC 0.9 (H) 0.0 - 0.2 %    Comment: Performed at [facility], [address] [place], [place] [address]  Troponin I (High Sensitivity)     Status: Abnormal   Collection Time: 03/06/21  1:50 PM  Result Value Ref Range   Troponin I (High Sensitivity) 147 (HH) <18 ng/L    Comment: CRITICAL RESULT CALLED TO, READ BACK BY AND VERIFIED WITH JOHN VANDRUFF @1543  ON 03/06/21 SKL (NOTE) Elevated high sensitivity troponin I (hsTnI) values and significant  changes across serial measurements may suggest ACS but many other  chronic and acute conditions are known to elevate hsTnI results.  Refer to the "Links" section for chest pain algorithms and additional  guidance. Performed at [facility], [address] [place], [place] [address]   Resp Panel by RT-PCR (Flu A&B, Covid) Nasopharyngeal Swab     Status: None   Collection Time: 03/06/21  1:50 PM   Specimen: Nasopharyngeal Swab; Nasopharyngeal(NP) swabs in vial transport medium  Result Value Ref Range   SARS Coronavirus 2 by RT PCR NEGATIVE NEGATIVE    Comment: (NOTE) SARS-CoV-2 target nucleic acids are NOT DETECTED.  The SARS-CoV-2 RNA is generally detectable in upper respiratory specimens during the acute phase of infection. The lowest concentration of SARS-CoV-2 viral copies this assay can detect is 138 copies/mL. A negative result does not preclude SARS-Cov-2 infection and should not be used as the sole basis for treatment or other patient management decisions. A negative result may occur with  improper specimen collection/handling, submission of specimen other than nasopharyngeal swab, presence of viral mutation(s) within the areas targeted by this assay, and inadequate number of viral copies(<138 copies/mL). A negative result must be combined with clinical observations, patient history, and epidemiological information. The expected result is Negative.  Fact Sheet for Patients:  BloggerCourse.comhttps://www.fda.gov/media/152166/download  Fact Sheet for Healthcare Providers:  SeriousBroker.ithttps://www.fda.gov/media/152162/download  This test is no t yet approved or cleared by the [place] FDA and  has been authorized for detection and/or diagnosis of SARS-CoV-2 by FDA under an Emergency Use Authorization (EUA). This EUA will remain  in effect (meaning this test can be used) for the duration of the COVID-19 declaration under Section 564(b)(1) of the Act, 21 U.S.C.section 360bbb-3(b)(1), unless the authorization is terminated  or revoked sooner.       Influenza A by PCR NEGATIVE NEGATIVE   Influenza B by PCR NEGATIVE NEGATIVE    Comment: (NOTE) The Xpert Xpress SARS-CoV-2/FLU/RSV plus assay is intended as an aid in the diagnosis of influenza from  Nasopharyngeal swab specimens and should not be used as a sole basis for treatment. Nasal washings and aspirates are unacceptable for Xpert Xpress SARS-CoV-2/FLU/RSV testing.  Fact Sheet for Patients: BloggerCourse.comhttps://www.fda.gov/media/152166/download  Fact Sheet for Healthcare Providers: SeriousBroker.ithttps://www.fda.gov/media/152162/download  This test is not yet approved or cleared by the [place] FDA and has been authorized for detection and/or diagnosis of SARS-CoV-2 by FDA under an Emergency Use Authorization (EUA). This EUA will remain in effect (meaning this test can be used) for the duration of the COVID-19 declaration under Section 564(b)(1) of the Act, 21 U.S.C. section 360bbb-3(b)(1), unless the authorization is terminated or revoked.  Performed at [facility], [address] [place], [place] [address]   Troponin I (High Sensitivity)     Status: Abnormal   Collection Time: 03/06/21  3:49 PM  Result Value Ref Range   Troponin I (High Sensitivity) 109 (HH) <18 ng/L    Comment: CRITICAL VALUE NOTED. VALUE IS CONSISTENT WITH PREVIOUSLY REPORTED/CALLED VALUE SKL (NOTE) Elevated high sensitivity troponin I (hsTnI) values and significant  changes across serial measurements may suggest ACS but many other  chronic and acute conditions are known to elevate hsTnI results.  Refer to the "Links" section for chest pain algorithms and additional  guidance. Performed at [facility], [address] [place], [place] [address]   Brain natriuretic peptide     Status: Abnormal   Collection Time: 03/06/21  5:25 PM  Result Value Ref Range   B Natriuretic Peptide 671.0 (H) 0.0 - 100.0 pg/mL    Comment: Performed at [facility], [address] [place], [place] [address]   DG Chest 2 View  Result Date: 03/06/2021 CLINICAL DATA:  Shortness of breath EXAM: CHEST - 2 VIEW COMPARISON:  05/17/2017 FINDINGS: The heart size and mediastinal contours are within normal limits.  Atherosclerotic calcification of the aortic knob. Advanced emphysematous changes within the upper lung fields. Small left pleural effusion with streaky left basilar opacity. Linear opacities in the right lung base which may reflect scarring or atelectasis. No pneumothorax. The visualized skeletal structures are unremarkable. IMPRESSION: 1. Small left pleural effusion with streaky left basilar opacity, which may reflect atelectasis versus infiltrate. 2. Advanced emphysematous changes within the upper lung fields. Electronically Signed   By: Duanne Guess D.O.   On: 03/06/2021 14:52   CT Angio Chest PE W and/or Wo Contrast  Result Date: 03/06/2021 CLINICAL DATA:  Decreased potassium level and shortness of breath EXAM: CT ANGIOGRAPHY CHEST WITH CONTRAST TECHNIQUE: Multidetector CT imaging of the chest was performed using the standard protocol during bolus administration of intravenous contrast. Multiplanar CT image reconstructions and MIPs were obtained to evaluate the vascular anatomy. CONTRAST:  97mL OMNIPAQUE IOHEXOL 350 MG/ML SOLN COMPARISON:  05/17/2017 FINDINGS: Cardiovascular: Thoracic aorta and its branches demonstrate atherosclerotic calcification without aneurysmal dilatation or dissection. Coronary calcifications are noted. Heart is mildly enlarged in size. Pulmonary artery shows a normal branching pattern. No definitive filling defect is identified. Mediastinum/Nodes: Thoracic inlet is within normal limits. No sizable hilar or mediastinal adenopathy is noted. The esophagus as visualized is within normal limits. Lungs/Pleura: Bilateral pleural effusions are noted of a moderate degree with bilateral lower lobe consolidation. Diffuse emphysematous changes are identified in both lungs worst in the apices. No focal pulmonary nodule is seen. Upper Abdomen: Visualized upper abdomen is within normal limits. Musculoskeletal: Old rib fractures are noted on the left. No acute bony abnormality is noted. Review  of the MIP images confirms the above findings. IMPRESSION: No evidence of pulmonary emboli. Bilateral pleural effusions with lower lobe consolidation. Chronic emphysematous changes are again seen. Aortic Atherosclerosis (ICD10-I70.0) and Emphysema (ICD10-J43.9). Electronically Signed   By: Alcide Clever M.D.   On: 03/06/2021 17:08    Review of Systems  Constitutional: Negative.   Eyes: Negative.   Respiratory: Negative.   Cardiovascular: Negative.   Gastrointestinal: Negative.   Genitourinary: Negative.   Musculoskeletal: Negative.   Skin: Negative.   Neurological: Negative.   Hematological: Negative.   Psychiatric/Behavioral: Negative.     Blood pressure 121/77, pulse 99, temperature 98.2 F (36.8 C), temperature source Oral, resp. rate 10, height 5\' 5"  (1.651 m), weight 68 kg, SpO2 95 %. Physical Exam Constitutional:      General: He is not in acute distress.    Appearance:  Normal appearance. He is normal weight.  HENT:     Head: Normocephalic and atraumatic.     Nose: Nose normal.     Mouth/Throat:     Mouth: Mucous membranes are moist.  Cardiovascular:     Rate and Rhythm: Normal rate and regular rhythm.     Pulses: Normal pulses.     Heart sounds: Normal heart sounds.  Abdominal:     General: Bowel sounds are normal.     Palpations: Abdomen is soft.  Musculoskeletal:     Cervical back: Normal range of motion and neck supple.  Skin:    General: Skin is warm.  Neurological:     General: No focal deficit present.     Mental Status: He is alert and oriented to person, place, and time.  Psychiatric:        Mood and Affect: Mood normal.      Assessment/Plan  #1.  Acute hypokalemia: Etiology unclear but most likely attributed to poor solute intake and possibly effects of oral hydrochlorothiazide.  We will replete potassium as appropriate.  Patient is on IV fluids plus KCl.  Serial Chem-7 with potassium monitoring.  #2.  Acute dyspnea on presentation most likely  secondary to chronic emphysema and new findings of bilateral pleural effusion.  CT angiogram ruled out pulm emboli.  Will obtain echocardiogram to evaluate for left ventricular ejection fraction.  Known history of coronary disease.  Caution with IV fluids.  #3.  Elevated cardiac markers: Present on admission .could likely be due to demand ischemia from hypoxia.  Due to prior history of coronary disease, will consult with cardiology.  EKG shows normal sinus rhythm with no acute ST-T wave changes suggesting ischemia.  Echocardiogram ordered to evaluate for wall motion abnormalities.  Patient is on metoprolol.  Patient is on Coumadin at baseline for unclear reasons.  INR pending.  #4.  Acute on chronic kidney injury: Baseline serum creatinine of 1.35.  Avoid nephrotoxic medications.  Caution with IV fluids.  Follow-up Chem-7 in a.m.  Lilia Pro, MD 03/06/2021, 6:46 PM

## 2021-03-06 NOTE — Progress Notes (Signed)
Skin swarm completed with Regina Eck. No skin issues identified. Placed on 4L via [place] due to desaturations on room air. Denies SOB, lungs clear, but diminished. Connected to tele monitor and continuous pulse ox. Given boxed lunch, pt ate all of his food. Oriented to room and call light system.

## 2021-03-06 NOTE — ED Triage Notes (Signed)
Pt states he was not feeling well on Friday , feeling weak and went to his PCP at [facility], states he is feeling better but they called his today with abnormally low K+ 2.3 and told him to go to the ED.

## 2021-03-07 ENCOUNTER — Inpatient Hospital Stay (HOSPITAL_COMMUNITY)
Admit: 2021-03-07 | Discharge: 2021-03-07 | Disposition: A | Discharge status: Home / Self Care | Payer: Medicare Other | Attending: Internal Medicine | Admitting: Internal Medicine

## 2021-03-07 DIAGNOSIS — I248 Other forms of acute ischemic heart disease: Secondary | ICD-10-CM

## 2021-03-07 DIAGNOSIS — J189 Pneumonia, unspecified organism: Principal | ICD-10-CM

## 2021-03-07 LAB — BASIC METABOLIC PANEL
Anion gap: 11 (ref 5–15)
BUN: 20 mg/dL (ref 8–23)
CO2: 28 mmol/L (ref 22–32)
Calcium: 8 mg/dL — ABNORMAL LOW (ref 8.9–10.3)
Chloride: 98 mmol/L (ref 98–111)
Creatinine, Ser: 1.68 mg/dL — ABNORMAL HIGH (ref 0.61–1.24)
GFR, Estimated: 44 mL/min — ABNORMAL LOW (ref >60)
Glucose, Bld: 251 mg/dL — ABNORMAL HIGH (ref 70–99)
Potassium: 2.4 mmol/L — CL (ref 3.5–5.1)
Sodium: 137 mmol/L (ref 135–145)

## 2021-03-07 LAB — CBC
HCT: 35.5 % — ABNORMAL LOW (ref 39.0–52.0)
Hemoglobin: 10.9 g/dL — ABNORMAL LOW (ref 13.0–17.0)
MCH: 29.5 pg (ref 26.0–34.0)
MCHC: 30.7 g/dL (ref 30.0–36.0)
MCV: 95.9 fL (ref 80.0–100.0)
Platelets: 201 10*3/uL (ref 150–400)
RBC: 3.7 MIL/uL — ABNORMAL LOW (ref 4.22–5.81)
RDW: 18.7 % — ABNORMAL HIGH (ref 11.5–15.5)
WBC: 3.7 10*3/uL — ABNORMAL LOW (ref 4.0–10.5)
nRBC: 0.5 % — ABNORMAL HIGH (ref 0.0–0.2)

## 2021-03-07 LAB — MAGNESIUM: Magnesium: 2.1 mg/dL (ref 1.7–2.4)

## 2021-03-07 LAB — PROTIME-INR
INR: 2 — ABNORMAL HIGH (ref 0.8–1.2)
Prothrombin Time: 22.4 seconds — ABNORMAL HIGH (ref 11.4–15.2)

## 2021-03-07 LAB — PROCALCITONIN: Procalcitonin: 0.45 ng/mL

## 2021-03-07 LAB — HIV ANTIBODY (ROUTINE TESTING W REFLEX): HIV Screen 4th Generation wRfx: NONREACTIVE

## 2021-03-07 MED ORDER — AZITHROMYCIN 500 MG PO TABS
500.0000 mg | ORAL_TABLET | Freq: Every day | ORAL | Status: AC
  Administered 2021-03-07 – 2021-03-10 (×4): 500 mg via ORAL
  Filled 2021-03-07 (×4): qty 1

## 2021-03-07 MED ORDER — WARFARIN - PHARMACIST DOSING INPATIENT: Freq: Every day | Status: DC

## 2021-03-07 MED ORDER — SODIUM CHLORIDE 0.9 % IV SOLN
1.0000 g | INTRAVENOUS | Status: DC
  Administered 2021-03-07 – 2021-03-09 (×3): 1 g via INTRAVENOUS
  Filled 2021-03-07: qty 10
  Filled 2021-03-07 (×3): qty 1

## 2021-03-07 MED ORDER — POTASSIUM CHLORIDE CRYS ER 20 MEQ PO TBCR
40.0000 meq | EXTENDED_RELEASE_TABLET | Freq: Two times a day (BID) | ORAL | Status: AC
  Administered 2021-03-07 (×2): 40 meq via ORAL
  Filled 2021-03-07 (×2): qty 2

## 2021-03-07 NOTE — Progress Notes (Signed)
   03/07/21 0106  Provider Notification  Provider Name/Title Dow Adolph DO  Date Provider Notified 03/07/21  Time Provider Notified [phone]  Notification Type  (secure text)  Notification Reason Critical result  Test performed and critical result potassium 2.4  Date Critical Result Received 03/07/21  Time Critical Result Received 0104  Provider response See new orders  Date of Provider Response 03/07/21  Time of Provider Response 0110

## 2021-03-07 NOTE — Plan of Care (Signed)
End of Shift Summary:  Alert and oriented x4. VSS. Remained on 4L via [place] to maintain sats  >90%/. Pt denies dyspnea at rest or on exertion. No visible signs of respiratory distress. Potassium replaced po and via IVF, currently at 2.4. Urine output adequate. Ambulated in room with standby assist. Denies pain or n/v. Remained free from falls or injury. Call bell within reach and able to use.   Problem: Education: Goal: Knowledge of General Education information will improve Description: Including pain rating scale, medication(s)/side effects and non-pharmacologic comfort measures Outcome: Progressing   Problem: Health Behavior/Discharge Planning: Goal: Ability to manage health-related needs will improve Outcome: Progressing   Problem: Clinical Measurements: Goal: Diagnostic test results will improve Outcome: Progressing Goal: Respiratory complications will improve Outcome: Progressing Goal: Cardiovascular complication will be avoided Outcome: Progressing   Problem: Nutrition: Goal: Adequate nutrition will be maintained Outcome: Progressing   Problem: Safety: Goal: Ability to remain free from injury will improve Outcome: Progressing

## 2021-03-07 NOTE — Progress Notes (Signed)
PROGRESS NOTE    Noah Dillon  ZOX:[id number] DOB: 09/05/1956 DOA: 03/06/2021 PCP: Sherron Mondayejan-Sie, S Ahmed, MD  122A/122A-AA   Assessment & Plan:   Active Problems:   Hypokalemia due to loss of potassium   Noah Dillon is an 65 y.o. male with medical history significant for hypertension, hyperlipidemia, coronary disease status post stent 5 years ago.  He is also previous tobacco smoker quit several years ago.  Patient apparently had been feeling unwell, went to see PCP on Friday, blood were drawn and results called in today with significant hypokalemia.  In the ED, patient was reported to be hypoxic requiring oxygen supplementation.  CT angiogram was done revealed emphysematous changes in the lungs, Bilateral pleural effusions with lower lobe consolidation.   # Acute hypoxic respiratory failure --80% on room air on presentation.  noted desat down to 87% even on 4L O2.  Pt denied dyspnea or cough.   --etiologies include PNA, COPD, pleural effusion. PLAN:  --treat underlying causes as below --Continue supplemental O2 to keep sats between 88-92%, wean as tolerated  # CAP --CT chest showed lower lobe consolidation.  Procal 0.45 Plan: --start ceftriaxone and azithromycin  # emphysematous changes  # likely COPD  # prior smoker --pt does not have a dx of COPD, but has emphysematous changes in his lungs on imaging.   --No obvious wheezes, cough, or dyspnea to indicate an acute exacerbation. Plan: --Hold off steroid for now --DuoNeb q4h while awake for bronchodilation.   # Pleural effusion --small left seen on CXR, bilateral seen on CT chest.  Not large enough to tap. --BNP 671 --Echo ordered on admission Plan: --f/u Echo read  # Hypokalemia --K 2.4 on presentation.  Unclear etiology.  Mag wnl. 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  Per pharm, pt is on warfarin for this indication.  Pt is supposed to have a repeat CT  scan in 6 months to see if he needs to continue warfarin, but pt didn't follow up. --CTA chest on presentation didn't show PE or thrombus in thoracic aorta. Plan: --d/c warfarin  # Elevated Cr --Cr 1.7 on presentation, up from 1.35 in 2018.  Unclear if AKI or progressive CKD. --Trend Cr  # Trop elevation likely due to demand ischemia --Trop 147 and 109.  No chest pain.  # HTN --BP intermittently low, HR elevated. --cont Toprol --Hold amlodipine  # CAD --cont ASA81, plavix and statin   DVT prophylaxis: Lovenox SQ Code Status: Full code  Family Communication:  Level of care: Med-Surg Dispo:   The patient is from: home Anticipated d/c is to: home Anticipated d/c date is: 2-3 days Patient currently is not medically ready to d/c due to: new 4-5L O2 requirement, IV abx for PNA   Subjective and Interval History:  Pt denied dyspnea, cough, denied pain.  However, RN reported pt desat down to 87% on 4L O2.     Objective: Vitals:   03/07/21 0756 03/07/21 1153 03/07/21 1539 03/07/21 2104  BP: 112/64 (!) 95/51 (!) 96/49 (!) 106/59  Pulse: 96 91 91 92  Resp: 18 17 18 16   Temp: 98 F (36.7 C) 97.8 F (36.6 C) 97.9 F (36.6 C) 97.9 F (36.6 C)  TempSrc: Oral     SpO2: (!) 89% (!) 87% (!) 89% 93%  Weight:      Height:        Intake/Output Summary (Last 24 hours) at 03/08/2021  0009 Last data filed at 03/07/2021 2104 Gross per 24 hour  Intake 2047.42 ml  Output 725 ml  Net 1322.42 ml   Filed Weights   03/06/21 1347 03/06/21 2258  Weight: 68 kg 68 kg    Examination:   Constitutional: NAD, AAOx3 HEENT: conjunctivae and lids normal, EOMI CV: No cyanosis.   RESP: normal respiratory effort, on 4L Extremities: No effusions, edema in BLE SKIN: warm, dry Neuro: II - XII grossly intact.   Psych: Normal mood and affect.  Appropriate judgement and reason   Data Reviewed: I have personally reviewed following labs and imaging studies  CBC: Recent Labs  Lab  03/06/21 1350 03/07/21 0026  WBC 6.7 3.7*  HGB 11.2* 10.9*  HCT 37.0* 35.5*  MCV 97.9 95.9  PLT 252 201   Basic Metabolic Panel: Recent Labs  Lab 03/06/21 1350 03/07/21 0026  NA 137 137  K 2.4* 2.4*  CL 97* 98  CO2 27 28  GLUCOSE 108* 251*  BUN 20 20  CREATININE 1.70* 1.68*  CALCIUM 8.2* 8.0*  MG  --  2.1   GFR: Estimated Creatinine Clearance: 37.1 mL/min (A) (by C-G formula based on SCr of 1.68 mg/dL (H)). Liver Function Tests: No results for input(s): AST, ALT, ALKPHOS, BILITOT, PROT, ALBUMIN in the last 168 hours. No results for input(s): LIPASE, AMYLASE in the last 168 hours. No results for input(s): AMMONIA in the last 168 hours. Coagulation Profile: Recent Labs  Lab 03/06/21 2059 03/07/21 0026  INR 1.7* 2.0*   Cardiac Enzymes: No results for input(s): CKTOTAL, CKMB, CKMBINDEX, TROPONINI in the last 168 hours. BNP (last 3 results) No results for input(s): PROBNP in the last 8760 hours. HbA1C: No results for input(s): HGBA1C in the last 72 hours. CBG: No results for input(s): GLUCAP in the last 168 hours. Lipid Profile: No results for input(s): CHOL, HDL, LDLCALC, TRIG, CHOLHDL, LDLDIRECT in the last 72 hours. Thyroid Function Tests: Recent Labs    03/06/21 2059  TSH 0.837   Anemia Panel: No results for input(s): VITAMINB12, FOLATE, FERRITIN, TIBC, IRON, RETICCTPCT in the last 72 hours. Sepsis Labs: Recent Labs  Lab 03/07/21 0026  PROCALCITON 0.45    Recent Results (from the past 240 hour(s))  Resp Panel by RT-PCR (Flu A&B, Covid) Nasopharyngeal Swab     Status: None   Collection Time: 03/06/21  1:50 PM   Specimen: Nasopharyngeal Swab; Nasopharyngeal(NP) swabs in vial transport medium  Result Value Ref Range Status   SARS Coronavirus 2 by RT PCR NEGATIVE NEGATIVE Final    Comment: (NOTE) SARS-CoV-2 target nucleic acids are NOT DETECTED.  The SARS-CoV-2 RNA is generally detectable in upper respiratory specimens during the acute phase of  infection. The lowest concentration of SARS-CoV-2 viral copies this assay can detect is 138 copies/mL. A negative result does not preclude SARS-Cov-2 infection and should not be used as the sole basis for treatment or other patient management decisions. A negative result may occur with  improper specimen collection/handling, submission of specimen other than nasopharyngeal swab, presence of viral mutation(s) within the areas targeted by this assay, and inadequate number of viral copies(<138 copies/mL). A negative result must be combined with clinical observations, patient history, and epidemiological information. The expected result is Negative.  Fact Sheet for Patients:  BloggerCourse.com  Fact Sheet for Healthcare Providers:  SeriousBroker.it  This test is no t yet approved or cleared by the [place] FDA and  has been authorized for detection and/or diagnosis of SARS-CoV-2 by FDA under  an Emergency Use Authorization (EUA). This EUA will remain  in effect (meaning this test can be used) for the duration of the COVID-19 declaration under Section 564(b)(1) of the Act, 21 U.S.C.section 360bbb-3(b)(1), unless the authorization is terminated  or revoked sooner.       Influenza A by PCR NEGATIVE NEGATIVE Final   Influenza B by PCR NEGATIVE NEGATIVE Final    Comment: (NOTE) The Xpert Xpress SARS-CoV-2/FLU/RSV plus assay is intended as an aid in the diagnosis of influenza from Nasopharyngeal swab specimens and should not be used as a sole basis for treatment. Nasal washings and aspirates are unacceptable for Xpert Xpress SARS-CoV-2/FLU/RSV testing.  Fact Sheet for Patients: BloggerCourse.com  Fact Sheet for Healthcare Providers: SeriousBroker.it  This test is not yet approved or cleared by the [place] FDA and has been authorized for detection and/or diagnosis of SARS-CoV-2  by FDA under an Emergency Use Authorization (EUA). This EUA will remain in effect (meaning this test can be used) for the duration of the COVID-19 declaration under Section 564(b)(1) of the Act, 21 U.S.C. section 360bbb-3(b)(1), unless the authorization is terminated or revoked.  Performed at [facility], [address], [place], [place] [address]       Radiology Studies: DG Chest 2 View  Result Date: 03/06/2021 CLINICAL DATA:  Shortness of breath EXAM: CHEST - 2 VIEW COMPARISON:  05/17/2017 FINDINGS: The heart size and mediastinal contours are within normal limits. Atherosclerotic calcification of the aortic knob. Advanced emphysematous changes within the upper lung fields. Small left pleural effusion with streaky left basilar opacity. Linear opacities in the right lung base which may reflect scarring or atelectasis. No pneumothorax. The visualized skeletal structures are unremarkable. IMPRESSION: 1. Small left pleural effusion with streaky left basilar opacity, which may reflect atelectasis versus infiltrate. 2. Advanced emphysematous changes within the upper lung fields. Electronically Signed   By: Duanne Guess D.O.   On: 03/06/2021 14:52   CT Angio Chest PE W and/or Wo Contrast  Result Date: 03/06/2021 CLINICAL DATA:  Decreased potassium level and shortness of breath EXAM: CT ANGIOGRAPHY CHEST WITH CONTRAST TECHNIQUE: Multidetector CT imaging of the chest was performed using the standard protocol during bolus administration of intravenous contrast. Multiplanar CT image reconstructions and MIPs were obtained to evaluate the vascular anatomy. CONTRAST:  71mL OMNIPAQUE IOHEXOL 350 MG/ML SOLN COMPARISON:  05/17/2017 FINDINGS: Cardiovascular: Thoracic aorta and its branches demonstrate atherosclerotic calcification without aneurysmal dilatation or dissection. Coronary calcifications are noted. Heart is mildly enlarged in size. Pulmonary artery shows a normal branching pattern. No  definitive filling defect is identified. Mediastinum/Nodes: Thoracic inlet is within normal limits. No sizable hilar or mediastinal adenopathy is noted. The esophagus as visualized is within normal limits. Lungs/Pleura: Bilateral pleural effusions are noted of a moderate degree with bilateral lower lobe consolidation. Diffuse emphysematous changes are identified in both lungs worst in the apices. No focal pulmonary nodule is seen. Upper Abdomen: Visualized upper abdomen is within normal limits. Musculoskeletal: Old rib fractures are noted on the left. No acute bony abnormality is noted. Review of the MIP images confirms the above findings. IMPRESSION: No evidence of pulmonary emboli. Bilateral pleural effusions with lower lobe consolidation. Chronic emphysematous changes are again seen. Aortic Atherosclerosis (ICD10-I70.0) and Emphysema (ICD10-J43.9). Electronically Signed   By: Alcide Clever M.D.   On: 03/06/2021 17:08     Scheduled Meds: . amLODipine  10 mg Oral Daily  . aspirin  81 mg Oral Daily  . azithromycin  500 mg Oral  Daily  . celecoxib  200 mg Oral q morning  . clopidogrel  75 mg Oral Daily  . ezetimibe  10 mg Oral Daily  . fluticasone  1 spray Each Nare Daily  . loratadine  10 mg Oral Daily  . metoprolol succinate  100 mg Oral Daily  . pantoprazole  40 mg Oral q morning  . rosuvastatin  40 mg Oral Daily  . warfarin  3 mg Oral Q M,W,F  . warfarin  4 mg Oral Once per day on Sun Tue Thu Sat  . Warfarin - Pharmacist Dosing Inpatient   Does not apply q1600   Continuous Infusions: . cefTRIAXone (ROCEPHIN)  IV 1 g (03/07/21 1637)     LOS: 2 days     Darlin Priestly, MD [facility] If 7PM-7AM, please contact night-coverage 03/08/2021, 12:09 AM

## 2021-03-07 NOTE — TOC Progression Note (Signed)
Transition of Care [facility]) - Progression Note    Patient Details  Name: Noah Dillon MRN: [id number] Date of Birth: [birth year]  Transition [facility] CM/SW Contact  Caryn Section, RN Phone Number: 03/07/2021, 4:36 PM  Clinical Narrative:    TOC in to see patient.  Patient lives at home with wife and son.  No Home Health or DME at present.  Patient has no concerns about getting to appointments or getting prescriptions.  No further questions or concerns for TOC at this time.  TOC contact information given to patient.        Expected Discharge Plan and Services                                                 Social Determinants of Health (SDOH) Interventions    Readmission Risk Interventions No flowsheet data found.

## 2021-03-07 NOTE — Progress Notes (Signed)
Pt s saturation is 87-89% this shift on 4l oxygen, MD notified ,  pt is asymptomatic, denies SOB at rest and exertion

## 2021-03-08 ENCOUNTER — Inpatient Hospital Stay: Payer: Medicare Other

## 2021-03-08 LAB — PROTIME-INR
INR: 1.8 — ABNORMAL HIGH (ref 0.8–1.2)
Prothrombin Time: 20.4 seconds — ABNORMAL HIGH (ref 11.4–15.2)

## 2021-03-08 LAB — BASIC METABOLIC PANEL
Anion gap: 9 (ref 5–15)
BUN: 16 mg/dL (ref 8–23)
CO2: 26 mmol/L (ref 22–32)
Calcium: 8.2 mg/dL — ABNORMAL LOW (ref 8.9–10.3)
Chloride: 107 mmol/L (ref 98–111)
Creatinine, Ser: 1.57 mg/dL — ABNORMAL HIGH (ref 0.61–1.24)
GFR, Estimated: 48 mL/min — ABNORMAL LOW (ref >60)
Glucose, Bld: 155 mg/dL — ABNORMAL HIGH (ref 70–99)
Potassium: 2.8 mmol/L — ABNORMAL LOW (ref 3.5–5.1)
Sodium: 142 mmol/L (ref 135–145)

## 2021-03-08 LAB — BODY FLUID CELL COUNT WITH DIFFERENTIAL
Eos, Fluid: 0 %
Lymphs, Fluid: 16 %
Monocyte-Macrophage-Serous Fluid: 8 %
Neutrophil Count, Fluid: 76 %
Total Nucleated Cell Count, Fluid: 173 cu mm

## 2021-03-08 LAB — ECHOCARDIOGRAM COMPLETE
Area-P 1/2: 3.37 cm2
Height: 65 in
S' Lateral: 2.73 cm
Weight: 2398.6 oz

## 2021-03-08 LAB — COMPREHENSIVE METABOLIC PANEL
ALT: 191 U/L — ABNORMAL HIGH (ref 0–44)
AST: 70 U/L — ABNORMAL HIGH (ref 15–41)
Albumin: 2.6 g/dL — ABNORMAL LOW (ref 3.5–5.0)
Alkaline Phosphatase: 82 U/L (ref 38–126)
Anion gap: 7 (ref 5–15)
BUN: 18 mg/dL (ref 8–23)
CO2: 27 mmol/L (ref 22–32)
Calcium: 7.8 mg/dL — ABNORMAL LOW (ref 8.9–10.3)
Chloride: 107 mmol/L (ref 98–111)
Creatinine, Ser: 1.62 mg/dL — ABNORMAL HIGH (ref 0.61–1.24)
GFR, Estimated: 46 mL/min — ABNORMAL LOW (ref >60)
Glucose, Bld: 120 mg/dL — ABNORMAL HIGH (ref 70–99)
Potassium: 2.3 mmol/L — CL (ref 3.5–5.1)
Sodium: 141 mmol/L (ref 135–145)
Total Bilirubin: 0.9 mg/dL (ref 0.3–1.2)
Total Protein: 5.9 g/dL — ABNORMAL LOW (ref 6.5–8.1)

## 2021-03-08 LAB — MAGNESIUM: Magnesium: 1.9 mg/dL (ref 1.7–2.4)

## 2021-03-08 LAB — CBC
HCT: 30.8 % — ABNORMAL LOW (ref 39.0–52.0)
Hemoglobin: 9.4 g/dL — ABNORMAL LOW (ref 13.0–17.0)
MCH: 29.7 pg (ref 26.0–34.0)
MCHC: 30.5 g/dL (ref 30.0–36.0)
MCV: 97.5 fL (ref 80.0–100.0)
Platelets: 202 10*3/uL (ref 150–400)
RBC: 3.16 MIL/uL — ABNORMAL LOW (ref 4.22–5.81)
RDW: 18.2 % — ABNORMAL HIGH (ref 11.5–15.5)
WBC: 6.1 10*3/uL (ref 4.0–10.5)
nRBC: 0 % (ref 0.0–0.2)

## 2021-03-08 LAB — PROTEIN, TOTAL: Total Protein: 6.3 g/dL — ABNORMAL LOW (ref 6.5–8.1)

## 2021-03-08 LAB — TROPONIN I (HIGH SENSITIVITY): Troponin I (High Sensitivity): 87 ng/L — ABNORMAL HIGH (ref <18)

## 2021-03-08 LAB — ALBUMIN, PLEURAL OR PERITONEAL FLUID: Albumin, Fluid: 1.2 g/dL

## 2021-03-08 LAB — LACTATE DEHYDROGENASE: LDH: 281 U/L — ABNORMAL HIGH (ref 98–192)

## 2021-03-08 LAB — GLUCOSE, PLEURAL OR PERITONEAL FLUID: Glucose, Fluid: 151 mg/dL

## 2021-03-08 LAB — PROCALCITONIN: Procalcitonin: 0.24 ng/mL

## 2021-03-08 MED ORDER — POTASSIUM CHLORIDE 10 MEQ/100ML IV SOLN
10.0000 meq | INTRAVENOUS | Status: AC
  Administered 2021-03-08 (×2): 10 meq via INTRAVENOUS
  Filled 2021-03-08 (×2): qty 100

## 2021-03-08 MED ORDER — POTASSIUM CHLORIDE CRYS ER 20 MEQ PO TBCR
40.0000 meq | EXTENDED_RELEASE_TABLET | ORAL | Status: AC
  Administered 2021-03-08 (×2): 40 meq via ORAL
  Filled 2021-03-08 (×2): qty 2

## 2021-03-08 MED ORDER — POTASSIUM CHLORIDE CRYS ER 20 MEQ PO TBCR: 40.0000 meq | EXTENDED_RELEASE_TABLET | Freq: Once | ORAL | Status: DC

## 2021-03-08 MED ORDER — ENOXAPARIN SODIUM 40 MG/0.4ML IJ SOSY
40.0000 mg | PREFILLED_SYRINGE | INTRAMUSCULAR | Status: DC
  Administered 2021-03-09 – 2021-03-10 (×2): 40 mg via SUBCUTANEOUS
  Filled 2021-03-08 (×2): qty 0.4

## 2021-03-08 MED ORDER — IPRATROPIUM-ALBUTEROL 0.5-2.5 (3) MG/3ML IN SOLN
3.0000 mL | RESPIRATORY_TRACT | Status: DC
  Administered 2021-03-08 (×2): 3 mL via RESPIRATORY_TRACT
  Filled 2021-03-08 (×2): qty 3

## 2021-03-08 MED ORDER — POTASSIUM CHLORIDE CRYS ER 20 MEQ PO TBCR
40.0000 meq | EXTENDED_RELEASE_TABLET | ORAL | Status: AC
  Administered 2021-03-08 (×2): 40 meq via ORAL
  Filled 2021-03-08 (×2): qty 2

## 2021-03-08 MED ORDER — POTASSIUM CHLORIDE 10 MEQ/100ML IV SOLN
10.0000 meq | INTRAVENOUS | Status: AC
  Administered 2021-03-08 (×2): 10 meq via INTRAVENOUS
  Filled 2021-03-08 (×2): qty 100

## 2021-03-08 MED ORDER — IPRATROPIUM-ALBUTEROL 0.5-2.5 (3) MG/3ML IN SOLN: 3.0000 mL | RESPIRATORY_TRACT | Status: DC | PRN

## 2021-03-08 NOTE — Progress Notes (Signed)
Per MD order , pt walk 10 to 12 step in hallway with this RN  and desat , no dyspnea noted, denies dyspnea also by patient. MD notified

## 2021-03-08 NOTE — Procedures (Signed)
Interventional Radiology Procedure:   Indications: Bilateral pleural effusions  Procedure: US guided thoracentesis  Findings: Removed 375 ml from left chest.  Complications: None     EBL: Less than 10 ml  Plan: Follow up CXR   Greg Cratty R. Lowella Dandy, MD  Pager: [phone]

## 2021-03-08 NOTE — Progress Notes (Signed)
PROGRESS NOTE  Noah Dillon  DOB: 26-Dec-1955  PCP: Sherron Monday, MD [id number]  DOA: 03/06/2021  LOS: 2 days  Hospital Day: 3  Chief Complaint  Patient presents with  . Abnormal Lab    Brief narrative: Noah Dillon is a 64 y.o. male, previous smoker, quit several years ago, with PMH significant for hypertension, hyperlipidemia, coronary disease status post stent 5 years ago. Patient apparently had been feeling unwell, went to see PCP on 5/13, blood were drawn and results called in 5/16 with significant hypokalemia and hence sent to the ED.  In the ED, patient was hypoxic and required supplemental oxygen. CT angio of chest did not show any pulm embolism but showed moderate bilateral pleural effusion with lower lobe consolidation as well as diffuse emphysematous changes. Admitted to hospitalist service See below for details  Subjective: Patient was seen and examined this morning.  Elderly African-American male. Lying on bed.  Not in distress.  On 4 L oxygen at rest.  Significantly desatted on ambulation per RN.  Assessment/Plan: Acute hypoxic respiratory failure -Per report, patient's oxygen saturation was 80% on room air on presentation and required up to 4 L. -Multifactorial etiology: Pneumonia, COPD, bilateral pleural effusion. -Currently on 4 L at rest.  Significantly desatted on ambulation today.  Expect improvement in respiratory status with resolution of pneumonia and effusion. -Wean down as tolerated.  Community-acquired pneumonia -Procalcitonin was elevated to 0.45, slightly down today. -Continue IV Rocephin and IV azithromycin for now. Recent Labs  Lab 03/06/21 1350 03/07/21 0026 03/08/21 0439  WBC 6.7 3.7* 6.1  PROCALCITON  --  0.45 0.24   Moderate bilateral pleural effusion -Bilateral moderate-sized pleural effusion seen on CT scan. -Because of dyspnea, will try ultrasound-guided thoracentesis.  COPD Previous history of smoking -Although  patient does not have a clear diagnosis of COPD but he has significant emphysematous changes in CT chest. -No wheezing on examination.  Not on steroids. -Continue bronchodilators. -Pending echocardiogram.  Hypokalemia -potassium low at 2.4 on presentation.  Continues to remain low.  Unclear etiology.  Not having any diarrhea.  Appetite good.  Patient does not report any history of chronic hypokalemia.  Replacement given. -Recheck tomorrow. 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  Per pharm, pt is on warfarin for this indication.  Pt is supposed to have a repeat CT scan in 6 months to see if he needs to continue warfarin, but pt didn't follow up. -CTA chest on presentation didn't show PE or thrombus in thoracic aorta. -Warfarin was discontinued.  AKI -Creatinine 1.35 at baseline from 2018.   -Patient with a creatinine of 1.7.  Gradually trending down. Recent Labs    03/06/21 1350 03/07/21 0026 03/08/21 0439  BUN 20 20 18   CREATININE 1.70* 1.68* 1.62*   Trop elevation likely due to demand ischemia -Trop 147 and 109.  No chest pain.  Repeat troponin with morning labs today -Pending echocardiogram. Recent Labs    03/06/21 1350 03/06/21 1549  TROPONINIHS 147* 109*   Essential hypertension -Home meds include amlodipine 10 mg daily, Toprol 200 mg daily. -Blood pressures currently controlled in normal range on Toprol.  Amlodipine remains on hold.   CAD --cont ASA81, plavix and statin, Zetia  Mobility: Encourage ambulation Code Status:   Code Status: Full Code  Nutritional status: Body mass index is 24.95  kg/m.     Diet Order            Diet Heart Room service appropriate? Yes; Fluid consistency: Thin  Diet effective now                 DVT prophylaxis: enoxaparin (LOVENOX) injection 40 mg Start: 03/09/21 1000 SCDs Start: 03/06/21 1812   Antimicrobials:   None Fluid: None Consultants: None Family Communication:  None at bedside  Status is: Inpatient  Remains inpatient appropriate because: Continues to require supplemental oxygen on rest as well as minimal ambulation  Dispo: The patient is from: Home              Anticipated d/c is to: Home in 2 to 3 days              Patient currently is not medically stable to d/c.   Difficult to place patient No     Infusions:  . cefTRIAXone (ROCEPHIN)  IV 1 g (03/07/21 1637)    Scheduled Meds: . aspirin  81 mg Oral Daily  . azithromycin  500 mg Oral Daily  . celecoxib  200 mg Oral q morning  . clopidogrel  75 mg Oral Daily  . [START ON 03/09/2021] enoxaparin (LOVENOX) injection  40 mg Subcutaneous Q24H  . ezetimibe  10 mg Oral Daily  . fluticasone  1 spray Each Nare Daily  . ipratropium-albuterol  3 mL Nebulization Q4H while awake  . loratadine  10 mg Oral Daily  . metoprolol succinate  100 mg Oral Daily  . pantoprazole  40 mg Oral q morning  . rosuvastatin  40 mg Oral Daily    Antimicrobials: Anti-infectives (From admission, onward)   Start     Dose/Rate Route Frequency Ordered Stop   03/07/21 1600  cefTRIAXone (ROCEPHIN) 1 g in sodium chloride 0.9 % 100 mL IVPB        1 g 200 mL/hr over 30 Minutes Intravenous Every 24 hours 03/07/21 0915     03/07/21 1600  azithromycin (ZITHROMAX) tablet 500 mg        500 mg Oral Daily 03/07/21 0916     03/06/21 1600  cefTRIAXone (ROCEPHIN) 1 g in sodium chloride 0.9 % 100 mL IVPB        1 g 200 mL/hr over 30 Minutes Intravenous  Once 03/06/21 1551 03/06/21 1637   03/06/21 1600  azithromycin (ZITHROMAX) 500 mg in sodium chloride 0.9 % 250 mL IVPB        500 mg 250 mL/hr over 60 Minutes Intravenous  Once 03/06/21 1551 03/06/21 1714      PRN meds: acetaminophen **OR** acetaminophen, albuterol   Objective: Vitals:   03/08/21 1138 03/08/21 1155  BP: 131/70   Pulse: 85   Resp: 20   Temp: 98.4 F (36.9 C)   SpO2: 95% 93%     Intake/Output Summary (Last 24 hours) at 03/08/2021 1208 Last data filed at 03/08/2021 0959 Gross per 24 hour  Intake 1365 ml  Output 1625 ml  Net -260 ml   Filed Weights   03/06/21 1347 03/06/21 2258  Weight: 68 kg 68 kg   Weight change:  Body mass index is 24.95 kg/m.   Physical Exam: General exam: Pleasant, elderly African-American. Skin: No rashes, lesions or ulcers. HEENT: Atraumatic, normocephalic, no obvious bleeding Lungs: Clear to auscultation bilaterally except for mild wheezing on the right lower lobe CVS: Regular rate and rhythm, no murmur GI/Abd soft, nontender, nondistended, bowel sound present CNS: Alert, awake, oriented  x3 Psychiatry: Mood appropriate Extremities: No pedal edema, no calf tenderness  Data Review: I have personally reviewed the laboratory data and studies available.  Recent Labs  Lab 03/06/21 1350 03/07/21 0026 03/08/21 0439  WBC 6.7 3.7* 6.1  HGB 11.2* 10.9* 9.4*  HCT 37.0* 35.5* 30.8*  MCV 97.9 95.9 97.5  PLT 252 201 202   Recent Labs  Lab 03/06/21 1350 03/07/21 0026 03/08/21 0439  NA 137 137 141  K 2.4* 2.4* 2.3*  CL 97* 98 107  CO2 27 28 27   GLUCOSE 108* 251* 120*  BUN 20 20 18   CREATININE 1.70* 1.68* 1.62*  CALCIUM 8.2* 8.0* 7.8*  MG  --  2.1 1.9    F/u labs ordered Unresulted Labs (From admission, onward)          Start     Ordered   03/09/21 0500  CBC with Differential/Platelet  Daily,   R      03/08/21 0750   03/09/21 0500  Comprehensive metabolic panel  Daily,   R      03/08/21 0750   03/09/21 0500  Magnesium  Tomorrow morning,   R        03/08/21 0750   03/09/21 0500  Phosphorus  Tomorrow morning,   R        03/08/21 0750   03/09/21 0500  Procalcitonin  Daily,   R      03/08/21 0750   03/08/21 1400  Basic metabolic panel  Once-Timed,   TIMED        03/08/21 1006   03/06/21 1908  Protime-INR  Daily,   STAT      03/06/21 1907          Signed, 03/08/21, MD [facility] 03/08/2021

## 2021-03-08 NOTE — Plan of Care (Signed)
End of Shift Summary:   Alert and oriented x4. VSS. Remained on 4L via [place] to maintain sats  >90%,, occasional desats to 86%, but pt recovers quickly. Pt denies dyspnea at rest or on exertion. No visible signs of respiratory distress.  Urine output adequate. Denies pain or n/v. Remained free from falls or injury. Call bell within reach and able to use.    Problem: Education: Goal: Knowledge of General Education information will improve Description: Including pain rating scale, medication(s)/side effects and non-pharmacologic comfort measures Outcome: Progressing   Problem: Health Behavior/Discharge Planning: Goal: Ability to manage health-related needs will improve Outcome: Progressing   Problem: Clinical Measurements: Goal: Diagnostic test results will improve Outcome: Progressing Goal: Respiratory complications will improve Outcome: Progressing Goal: Cardiovascular complication will be avoided Outcome: Progressing   Problem: Safety: Goal: Ability to remain free from injury will improve Outcome: Progressing

## 2021-03-09 ENCOUNTER — Inpatient Hospital Stay: Payer: Medicare Other

## 2021-03-09 LAB — PROTIME-INR
INR: 1.5 — ABNORMAL HIGH (ref 0.8–1.2)
Prothrombin Time: 17.8 seconds — ABNORMAL HIGH (ref 11.4–15.2)

## 2021-03-09 LAB — CBC WITH DIFFERENTIAL/PLATELET
Abs Immature Granulocytes: 0.03 10*3/uL (ref 0.00–0.07)
Basophils Absolute: 0 10*3/uL (ref 0.0–0.1)
Basophils Relative: 0 %
Eosinophils Absolute: 0.3 10*3/uL (ref 0.0–0.5)
Eosinophils Relative: 5 %
HCT: 34.3 % — ABNORMAL LOW (ref 39.0–52.0)
Hemoglobin: 10.1 g/dL — ABNORMAL LOW (ref 13.0–17.0)
Immature Granulocytes: 1 %
Lymphocytes Relative: 22 %
Lymphs Abs: 1.3 10*3/uL (ref 0.7–4.0)
MCH: 29.7 pg (ref 26.0–34.0)
MCHC: 29.4 g/dL — ABNORMAL LOW (ref 30.0–36.0)
MCV: 100.9 fL — ABNORMAL HIGH (ref 80.0–100.0)
Monocytes Absolute: 0.5 10*3/uL (ref 0.1–1.0)
Monocytes Relative: 8 %
Neutro Abs: 3.8 10*3/uL (ref 1.7–7.7)
Neutrophils Relative %: 64 %
Platelets: 210 10*3/uL (ref 150–400)
RBC: 3.4 MIL/uL — ABNORMAL LOW (ref 4.22–5.81)
RDW: 18.1 % — ABNORMAL HIGH (ref 11.5–15.5)
WBC: 6 10*3/uL (ref 4.0–10.5)
nRBC: 0 % (ref 0.0–0.2)

## 2021-03-09 LAB — COMPREHENSIVE METABOLIC PANEL
ALT: 167 U/L — ABNORMAL HIGH (ref 0–44)
AST: 77 U/L — ABNORMAL HIGH (ref 15–41)
Albumin: 2.8 g/dL — ABNORMAL LOW (ref 3.5–5.0)
Alkaline Phosphatase: 88 U/L (ref 38–126)
Anion gap: 6 (ref 5–15)
BUN: 15 mg/dL (ref 8–23)
CO2: 24 mmol/L (ref 22–32)
Calcium: 8.1 mg/dL — ABNORMAL LOW (ref 8.9–10.3)
Chloride: 115 mmol/L — ABNORMAL HIGH (ref 98–111)
Creatinine, Ser: 1.62 mg/dL — ABNORMAL HIGH (ref 0.61–1.24)
GFR, Estimated: 46 mL/min — ABNORMAL LOW (ref >60)
Glucose, Bld: 104 mg/dL — ABNORMAL HIGH (ref 70–99)
Potassium: 3.8 mmol/L (ref 3.5–5.1)
Sodium: 145 mmol/L (ref 135–145)
Total Bilirubin: 0.7 mg/dL (ref 0.3–1.2)
Total Protein: 6.2 g/dL — ABNORMAL LOW (ref 6.5–8.1)

## 2021-03-09 LAB — PROCALCITONIN: Procalcitonin: 0.18 ng/mL

## 2021-03-09 LAB — PHOSPHORUS: Phosphorus: 1.7 mg/dL — ABNORMAL LOW (ref 2.5–4.6)

## 2021-03-09 LAB — MAGNESIUM: Magnesium: 1.7 mg/dL (ref 1.7–2.4)

## 2021-03-09 MED ORDER — K PHOS MONO-SOD PHOS DI & MONO 155-852-130 MG PO TABS
500.0000 mg | ORAL_TABLET | Freq: Two times a day (BID) | ORAL | Status: DC
  Administered 2021-03-09 – 2021-03-10 (×3): 500 mg via ORAL
  Filled 2021-03-09 (×4): qty 2

## 2021-03-09 NOTE — Progress Notes (Signed)
PROGRESS NOTE  IZZY COURVILLE  DOB: [birth year]  PCP: Sherron Monday, MD KZL:[id number]  DOA: 03/06/2021  LOS: 3 days  Hospital Day: 4  Chief Complaint  Patient presents with  . Abnormal Lab    Brief narrative: Jaylee LEORY ALLINSON is a 65 y.o. male, previous smoker, quit several years ago, with PMH significant for hypertension, hyperlipidemia, coronary disease status post stent 5 years ago. Patient apparently had been feeling unwell, went to see PCP on 5/13, blood were drawn and results called in 5/16 with significant hypokalemia and hence sent to the ED.  In the ED, patient was hypoxic and required supplemental oxygen. CT angio of chest did not show any pulm embolism but showed moderate bilateral pleural effusion with lower lobe consolidation as well as diffuse emphysematous changes. Admitted to hospitalist service See below for details  Subjective: Patient was seen and examined this morning.   Feels better after thoracentesis.  On less oxygen requirement today.  Assessment/Plan: Acute hypoxic respiratory failure -Per report, patient's oxygen saturation was 80% on room air on presentation and required up to 4 L. -Multifactorial etiology: Pneumonia, COPD, bilateral pleural effusion. -Was requiring up to 4 L by nasal cannula yesterday.  Today down to 1 L and able to ambulate on the same. -Wean down as tolerated.  Check for ambulatory oxygen requirement tomorrow prior to discharge.  Community-acquired pneumonia -Continue IV Rocephin and IV azithromycin for now. -Procalcitonin level improving.  Hoping to discharge home on oral antibiotics tomorrow Recent Labs  Lab 03/06/21 1350 03/07/21 0026 03/08/21 0439 03/09/21 0525  WBC 6.7 3.7* 6.1 6.0  PROCALCITON  --  0.45 0.24 0.18   Moderate bilateral pleural effusion -Bilateral moderate-sized pleural effusion seen on CT scan. -5/18, left thoracentesis done yielding 375 mL. -5/19, right thoracentesis was done  yielding  COPD Previous history of smoking -Although patient does not have a clear diagnosis of COPD but he has significant emphysematous changes in CT chest. -No wheezing on examination.  Not on steroids. -Continue bronchodilators. -Echocardiogram with EF 60 to 65%  Hypokalemia Hypophosphatemia -potassium low at 2.4 on presentation.  Improving with aggressive replacement.  -Phosphorus level low today.  Oral replacement given.  -Recheck labs tomorrow. 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  Per pharm, pt is on warfarin for this indication.  Pt is supposed to have a repeat CT scan in 6 months to see if he needs to continue warfarin, but pt didn't follow up. -CTA chest on presentation didn't show PE or thrombus in thoracic aorta. -Warfarin was discontinued.  AKI -Creatinine 1.35 at baseline from 2018.   -Patient with a creatinine of 1.7.  Remains elevated between 1.6-1.7.  Probably new baseline. Recent Labs    03/06/21 1350 03/07/21 0026 03/08/21 0439 03/08/21 1352 03/09/21 0525  BUN 20 20 18 16 15   CREATININE 1.70* 1.68* 1.62* 1.57* 1.62*   Trop elevation likely due to demand ischemia -Trop 147 at presentation.  Trended down.  Echocardiogram did not show any wall motion abnormality.   Recent Labs    03/06/21 1549 03/08/21 1352  TROPONINIHS 109* 87*   Essential hypertension -Home meds include amlodipine 10 mg daily, Toprol 200 mg daily. -Blood pressures  currently controlled in normal range on Toprol.  Amlodipine remains on hold.   CAD --cont ASA81, plavix and statin, Zetia  Mobility: Encourage ambulation Code Status:   Code Status: Full Code  Nutritional status: Body mass index is 24.95 kg/m.     Diet Order            Diet Heart Room service appropriate? Yes; Fluid  consistency: Thin  Diet effective now                 DVT prophylaxis: enoxaparin (LOVENOX) injection 40 mg Start: 03/09/21 1000 SCDs Start: 03/06/21 1812   Antimicrobials:  None Fluid: None Consultants: None Family Communication:  None at bedside  Status is: Inpatient  Remains inpatient appropriate because: Continues to require supplemental oxygen on rest as well as minimal ambulation  Dispo: The patient is from: Home              Anticipated d/c is to: Home tomorrow              Patient currently is not medically stable to d/c.   Difficult to place patient No     Infusions:  . cefTRIAXone (ROCEPHIN)  IV Stopped (03/08/21 1804)    Scheduled Meds: . aspirin  81 mg Oral Daily  . azithromycin  500 mg Oral Daily  . celecoxib  200 mg Oral q morning  . clopidogrel  75 mg Oral Daily  . enoxaparin (LOVENOX) injection  40 mg Subcutaneous Q24H  . ezetimibe  10 mg Oral Daily  . fluticasone  1 spray Each Nare Daily  . loratadine  10 mg Oral Daily  . metoprolol succinate  100 mg Oral Daily  . pantoprazole  40 mg Oral q morning  . rosuvastatin  40 mg Oral Daily    Antimicrobials: Anti-infectives (From admission, onward)   Start     Dose/Rate Route Frequency Ordered Stop   03/07/21 1600  cefTRIAXone (ROCEPHIN) 1 g in sodium chloride 0.9 % 100 mL IVPB        1 g 200 mL/hr over 30 Minutes Intravenous Every 24 hours 03/07/21 0915 03/10/21 2359   03/07/21 1600  azithromycin (ZITHROMAX) tablet 500 mg        500 mg Oral Daily 03/07/21 0916 03/10/21 2359   03/06/21 1600  cefTRIAXone (ROCEPHIN) 1 g in sodium chloride 0.9 % 100 mL IVPB        1 g 200 mL/hr over 30 Minutes Intravenous  Once 03/06/21 1551 03/06/21 1637   03/06/21 1600  azithromycin (ZITHROMAX) 500 mg in sodium chloride 0.9 % 250 mL IVPB        500 mg 250 mL/hr over 60 Minutes Intravenous  Once 03/06/21 1551 03/06/21 1714      PRN meds: acetaminophen **OR** acetaminophen, albuterol, ipratropium-albuterol    Objective: Vitals:   03/09/21 1100 03/09/21 1159  BP: 120/61 117/67  Pulse: 98 85  Resp: 18 18  Temp:  98.5 F (36.9 C)  SpO2:      Intake/Output Summary (Last 24 hours) at 03/09/2021 1436 Last data filed at 03/09/2021 1012 Gross per 24 hour  Intake 547 ml  Output 1275 ml  Net -728 ml   Filed Weights   03/06/21 1347 03/06/21 2258  Weight: 68 kg 68 kg   Weight change:  Body mass index is 24.95 kg/m.   Physical Exam: General exam: Pleasant, elderly African-American.  Not in distress Skin: No rashes, lesions or ulcers. HEENT: Atraumatic, normocephalic, no obvious bleeding Lungs: Clear to auscultation  bilaterally on low-flow oxygen CVS: Regular rate and rhythm, no murmur GI/Abd soft, nontender, nondistended, bowel sound present CNS: Alert, awake, oriented x3 Psychiatry: Mood cheerful today. Extremities: No pedal edema, no calf tenderness  Data Review: I have personally reviewed the laboratory data and studies available.  Recent Labs  Lab 03/06/21 1350 03/07/21 0026 03/08/21 0439 03/09/21 0525  WBC 6.7 3.7* 6.1 6.0  NEUTROABS  --   --   --  3.8  HGB 11.2* 10.9* 9.4* 10.1*  HCT 37.0* 35.5* 30.8* 34.3*  MCV 97.9 95.9 97.5 100.9*  PLT 252 201 202 210   Recent Labs  Lab 03/06/21 1350 03/07/21 0026 03/08/21 0439 03/08/21 1352 03/09/21 0525  NA 137 137 141 142 145  K 2.4* 2.4* 2.3* 2.8* 3.8  CL 97* 98 107 107 115*  CO2 27 28 27 26 24   GLUCOSE 108* 251* 120* 155* 104*  BUN 20 20 18 16 15   CREATININE 1.70* 1.68* 1.62* 1.57* 1.62*  CALCIUM 8.2* 8.0* 7.8* 8.2* 8.1*  MG  --  2.1 1.9  --  1.7  PHOS  --   --   --   --  1.7*    F/u labs ordered Unresulted Labs (From admission, onward)          Start     Ordered   03/09/21 0500  CBC with Differential/Platelet  Daily,   R      03/08/21 0750   03/09/21 0500  Comprehensive metabolic panel  Daily,   R      03/08/21 0750   03/09/21 0500  Procalcitonin  Daily,   R      03/08/21 0750   03/08/21 1529  Body  fluid culture (includes gram stain)  (Thoracentesis Labs Panel)  Once,   R       Question:  Are there also cytology or pathology orders on this specimen?  Answer:  Yes   03/08/21 1529          Signed, 03/10/21, MD [facility] 03/09/2021

## 2021-03-09 NOTE — Plan of Care (Addendum)
End of Shift Summary:   Alert and oriented x4. VSS. Titrated from 4L to 1L via [place] to maintain sats >92%, desats to 87% on room air. K+ 3.8 from 2.8 after replacements. Pt denies dyspnea at rest or on exertion. No visible signs of respiratory distress.  mild swelling at site of thoracentesis. Urine output adequate. Denies pain or n/v. Remained free from falls or injury. Call bell within reach and able to use.    Problem: Education: Goal: Knowledge of General Education information will improve Description: Including pain rating scale, medication(s)/side effects and non-pharmacologic comfort measures Outcome: Progressing   Problem: Health Behavior/Discharge Planning: Goal: Ability to manage health-related needs will improve Outcome: Progressing   Problem: Clinical Measurements: Goal: Diagnostic test results will improve Outcome: Progressing Goal: Respiratory complications will improve Outcome: Progressing Goal: Cardiovascular complication will be avoided Outcome: Progressing   Problem: Nutrition: Goal: Adequate nutrition will be maintained Outcome: Progressing   Problem: Safety: Goal: Ability to remain free from injury will improve Outcome: Progressing

## 2021-03-09 NOTE — Care Management Important Message (Signed)
Important Message  Patient Details  Name: Noah Dillon MRN: [id number] Date of Birth: 05-14-1956   Medicare Important Message Given:  N/A - LOS <3 / Initial given by admissions     Olegario Messier A Karas Pickerill 03/09/2021, 7:44 AM

## 2021-03-10 LAB — COMPREHENSIVE METABOLIC PANEL
ALT: 147 U/L — ABNORMAL HIGH (ref 0–44)
AST: 79 U/L — ABNORMAL HIGH (ref 15–41)
Albumin: 2.8 g/dL — ABNORMAL LOW (ref 3.5–5.0)
Alkaline Phosphatase: 81 U/L (ref 38–126)
Anion gap: 6 (ref 5–15)
BUN: 17 mg/dL (ref 8–23)
CO2: 22 mmol/L (ref 22–32)
Calcium: 8.3 mg/dL — ABNORMAL LOW (ref 8.9–10.3)
Chloride: 112 mmol/L — ABNORMAL HIGH (ref 98–111)
Creatinine, Ser: 1.35 mg/dL — ABNORMAL HIGH (ref 0.61–1.24)
GFR, Estimated: 58 mL/min — ABNORMAL LOW (ref >60)
Glucose, Bld: 108 mg/dL — ABNORMAL HIGH (ref 70–99)
Potassium: 3.2 mmol/L — ABNORMAL LOW (ref 3.5–5.1)
Sodium: 140 mmol/L (ref 135–145)
Total Bilirubin: 0.8 mg/dL (ref 0.3–1.2)
Total Protein: 6.2 g/dL — ABNORMAL LOW (ref 6.5–8.1)

## 2021-03-10 LAB — CBC WITH DIFFERENTIAL/PLATELET
Abs Immature Granulocytes: 0.02 10*3/uL (ref 0.00–0.07)
Basophils Absolute: 0 10*3/uL (ref 0.0–0.1)
Basophils Relative: 1 %
Eosinophils Absolute: 0.3 10*3/uL (ref 0.0–0.5)
Eosinophils Relative: 5 %
HCT: 35.7 % — ABNORMAL LOW (ref 39.0–52.0)
Hemoglobin: 10.6 g/dL — ABNORMAL LOW (ref 13.0–17.0)
Immature Granulocytes: 0 %
Lymphocytes Relative: 26 %
Lymphs Abs: 1.6 10*3/uL (ref 0.7–4.0)
MCH: 29.2 pg (ref 26.0–34.0)
MCHC: 29.7 g/dL — ABNORMAL LOW (ref 30.0–36.0)
MCV: 98.3 fL (ref 80.0–100.0)
Monocytes Absolute: 0.5 10*3/uL (ref 0.1–1.0)
Monocytes Relative: 8 %
Neutro Abs: 3.5 10*3/uL (ref 1.7–7.7)
Neutrophils Relative %: 60 %
Platelets: 219 10*3/uL (ref 150–400)
RBC: 3.63 MIL/uL — ABNORMAL LOW (ref 4.22–5.81)
RDW: 17 % — ABNORMAL HIGH (ref 11.5–15.5)
WBC: 6 10*3/uL (ref 4.0–10.5)
nRBC: 0 % (ref 0.0–0.2)

## 2021-03-10 LAB — PROCALCITONIN: Procalcitonin: 0.13 ng/mL

## 2021-03-10 LAB — CYTOLOGY - NON PAP

## 2021-03-10 MED ORDER — CEFDINIR 300 MG PO CAPS: 300.0000 mg | ORAL_CAPSULE | Freq: Two times a day (BID) | ORAL | 0 refills | Status: AC

## 2021-03-10 MED ORDER — METOPROLOL SUCCINATE ER 100 MG PO TB24: 100.0000 mg | ORAL_TABLET | Freq: Every day | ORAL | 2 refills | Status: DC

## 2021-03-10 NOTE — Discharge Summary (Signed)
Physician Discharge Summary  Noah Dillon ZOX:[id number] DOB: [birth year] DOA: 03/06/2021  PCP: Sherron Monday, MD  Admit date: 03/06/2021 Discharge date: 03/10/2021  Admitted From: Home Discharge disposition: Home   Code Status: Full Code  Diet Recommendation: Heart healthy diet  Discharge Diagnosis:   Active Problems:   Hypokalemia due to loss of potassium   Chief Complaint  Patient presents with  . Abnormal Lab    Brief narrative: Noah Dillon is a 65 y.o. male, previous smoker, quit several years ago, with PMH significant for hypertension, hyperlipidemia, coronary disease status post stent 5 years ago. Patient apparently had been feeling unwell, went to see PCP on 5/13, blood were drawn and results called in 5/16 with significant hypokalemia and hence sent to the ED.  In the ED, patient was hypoxic and required supplemental oxygen. CT angio of chest did not show any pulm embolism but showed moderate bilateral pleural effusion with lower lobe consolidation as well as diffuse emphysematous changes. Admitted to hospitalist service See below for details  Subjective: Patient was seen and examined this morning.   Lying down in bed.  Not in distress.  Not on supplemental oxygen today.  Feels much better and ready to go home.  Hospital course: Acute hypoxic respiratory failure -Per report, patient's oxygen saturation was 80% on room air on presentation and required up to 4 L. -Multifactorial etiology: Pneumonia, COPD, bilateral pleural effusion. -Was requiring up to 4 L by nasal cannula at first.  Improved gradually with treatment of pneumonia and thoracentesis.  Able to ambulate on the hallway today without oxygen.  Community-acquired pneumonia -Continue IV Rocephin and IV azithromycin for now. -Procalcitonin level improving.   -Discharge home today on 5 more days of oral Omnicef. Recent Labs  Lab 03/06/21 1350 03/07/21 0026 03/08/21 0439 03/09/21 0525  03/10/21 0709  WBC 6.7 3.7* 6.1 6.0 6.0  PROCALCITON  --  0.45 0.24 0.18 0.13   Moderate bilateral pleural effusion -Bilateral moderate-sized pleural effusion seen on CT scan. -5/18, left thoracentesis - 375 mL drained. -5/19, right thoracentesis -500 mils drained.  COPD Previous history of smoking -Although patient does not have a clear diagnosis of COPD but he has significant emphysematous changes in CT chest. -No wheezing on examination.  Not on steroids. -Continue bronchodilators. -Echocardiogram with EF 60 to 65%  Hypokalemia Hypophosphatemia -Levels improved with replacement.  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  Per pharm, pt is on warfarin for this indication.  Pt is supposed to have a repeat CT scan in 6 months to see if he needs to continue warfarin, but pt didn't follow up. -CTA chest on presentation didn't show PE or thrombus in thoracic aorta. -Warfarin was discontinued.  AKI -Creatinine 1.35 at baseline from 2018.   -Patient with a creatinine of 1.7.  Improved back to baseline today. Recent Labs    03/06/21 1350 03/07/21 0026 03/08/21 0439 03/08/21 1352 03/09/21 0525 03/10/21 0709  BUN CREATININE 1.70* 1.68* 1.62* 1.57* 1.62* 1.35*   Trop elevation likely due to demand ischemia -Trop 147 at presentation.  Trended down.  Echocardiogram did not show any wall motion abnormality.  Recent Labs    03/08/21 1352  TROPONINIHS 87*   Essential hypertension -Home meds include amlodipine 10 mg daily, Toprol 200 mg daily. -Blood pressure and heart rate currently controlled in normal range on Toprol 100 mg daily.  Amlodipine remains on hold.  No need to resume post discharge.  CAD --cont ASA81, plavix and statin, Zetia   Wound care:     Discharge Exam:   Vitals:   03/09/21 2334 03/10/21 0402 03/10/21 0759 03/10/21 1135  BP: 119/66 119/68 110/70 117/70  Pulse: 83 80 84 84  Resp: Temp: 98.8 F (37.1 C) 98.2 F (36.8 C) 98.6 F (37 C) 98.2 F (36.8 C)  TempSrc: Oral  Oral Axillary  SpO2: 92% 95% 98% 97%  Weight:      Height:        Body mass index is 24.95 kg/m.  General exam: Pleasant, elderly African-American male.  Not in distress Skin: No rashes, lesions or ulcers. HEENT: Atraumatic, normocephalic, no obvious bleeding Lungs: Clear to auscultation bilaterally CVS: Regular rate and rhythm, no murmur GI/Abd soft, nontender, nondistended, bowel sound present CNS: Alert, awake, oriented x3 Psychiatry: Mood appropriate Extremities: No pedal edema, no calf tenderness  Follow ups:   Discharge Instructions    Diet - low sodium heart healthy   Complete by: As directed    Increase activity slowly   Complete by: As directed       Follow-up Information    Sherron Monday, MD Follow up.   Specialty: Internal Medicine Contact information: [address] [place] [address] [phone]               Recommendations for Outpatient Follow-Up:   1. Follow-up with PCP as an outpatient  Discharge Instructions:  Follow with Primary MD Sherron Monday, MD in 7 days   Get CBC/BMP checked in next visit within 1 week by PCP or SNF MD ( we routinely change or add medications that can affect your baseline labs and fluid status, therefore we recommend that you get the mentioned basic workup next visit with your PCP, your PCP may decide not to get them or add new tests based on their clinical decision)  On your next visit with your PCP, please Get Medicines reviewed and adjusted.  Please request your PCP  to go over all Hospital Tests and Procedure/Radiological results at the follow up, please get all Hospital records sent to your Prim MD by signing hospital release before you go  home.  Activity: As tolerated with Full fall precautions use walker/cane & assistance as needed  For Heart failure patients - Check your Weight same time everyday, if you gain over 2 pounds, or you develop in leg swelling, experience more shortness of breath or chest pain, call your Primary MD immediately. Follow Cardiac Low Salt Diet and 1.5 lit/day fluid restriction.  If you have smoked or chewed Tobacco in the last 2 yrs please stop smoking, stop any regular Alcohol  and or any Recreational drug use.  If you experience worsening of your admission symptoms, develop shortness of breath, life threatening emergency, suicidal or homicidal thoughts you must seek medical attention immediately by calling 911 or calling your MD immediately  if symptoms less severe.  You Must read complete instructions/literature along with all the possible adverse reactions/side effects for all the Medicines you take and that have been prescribed to you. Take any new Medicines after you have completely understood and accpet all the possible  adverse reactions/side effects.   Do not drive, operate heavy machinery, perform activities at heights, swimming or participation in water activities or provide baby sitting services if your were admitted for syncope or siezures until you have seen by Primary MD or a Neurologist and advised to do so again.  Do not drive when taking Pain medications.  Do not take more than prescribed Pain, Sleep and Anxiety Medications  Wear Seat belts while driving.   Please note You were cared for by a hospitalist during your hospital stay. If you have any questions about your discharge medications or the care you received while you were in the hospital after you are discharged, you can call the unit and asked to speak with the hospitalist on call if the hospitalist that took care of you is not available. Once you are discharged, your primary care physician will handle any further medical issues.  Please note that NO REFILLS for any discharge medications will be authorized once you are discharged, as it is imperative that you return to your primary care physician (or establish a relationship with a primary care physician if you do not have one) for your aftercare needs so that they can reassess your need for medications and monitor your lab values.    Allergies as of 03/10/2021      Reactions   Ace Inhibitors Swelling      Medication List    STOP taking these medications   amLODipine 10 MG tablet Commonly known as: NORVASC   warfarin 3 MG tablet Commonly known as: COUMADIN   warfarin 4 MG tablet Commonly known as: COUMADIN     TAKE these medications   cefdinir 300 MG capsule Commonly known as: OMNICEF Take 1 capsule (300 mg total) by mouth 2 (two) times daily for 5 days.   celecoxib 200 MG capsule Commonly known as: CELEBREX Take 200 mg by mouth every morning.   ezetimibe 10 MG tablet Commonly known as: ZETIA Take 10 mg by mouth daily.   fluticasone 50 MCG/ACT nasal spray Commonly known as: FLONASE Place 1 spray into both nostrils daily.   loratadine 10 MG tablet Commonly known as: CLARITIN Take 10 mg by mouth daily.   metoprolol succinate 100 MG 24 hr tablet Commonly known as: TOPROL-XL Take 1 tablet (100 mg total) by mouth daily. What changed: how much to take   pantoprazole 40 MG tablet Commonly known as: PROTONIX Take 40 mg by mouth every morning.   rosuvastatin 40 MG tablet Commonly known as: CRESTOR Take 40 mg by mouth daily.       Time coordinating discharge: 35 minutes  The results of significant diagnostics from this hospitalization (including imaging, microbiology, ancillary and laboratory) are listed below for reference.    Procedures and Diagnostic Studies:   DG Chest Port 1 View  Result Date: 03/08/2021 CLINICAL DATA:  Post left thoracentesis. EXAM: PORTABLE CHEST 1 VIEW COMPARISON:  03/06/2021 FINDINGS: Improved aeration at the  left lung base compatible with recent thoracentesis. No significant left pleural fluid. Air bronchograms and residual consolidation in the medial left lung base. Negative for left pneumothorax. Again noted are bullous emphysematous changes. Slightly increased parenchymal densities in the right lower chest. Heart size is stable. Trachea is midline. Atherosclerotic calcifications at the aortic arch. IMPRESSION: 1. Improved aeration at the left lung base following the thoracentesis. Negative for pneumothorax. 2. Emphysematous changes with slightly increased parenchymal densities in the right lower chest. 3. Persistent consolidation at the medial left lung base. Electronically  Signed   By: Richarda Overlie M.D.   On: 03/08/2021 15:51   ECHOCARDIOGRAM COMPLETE  Result Date: 03/08/2021    ECHOCARDIOGRAM REPORT   Patient Name:   CORDELLE DAHMEN Scharnhorst Date of Exam: 03/07/2021 Medical Rec #:  [id number]       Height:       65.0 in Accession #:    [id number]      Weight:       149.9 lb Date of Birth:  1956/10/07        BSA:          1.750 m Patient Age:    65 years        BP:           96/49 mmHg Patient Gender: M               HR:           93 bpm. Exam Location:  [facility] Procedure: 2D Echo, Cardiac Doppler and Color Doppler Indications:     I24.9 Acite Ischemic heart disease.  History:         Patient has no prior history of Echocardiogram examinations.                  Risk Factors:Hypertension and Dyslipidemia. Myocardial                  infarction.  Sonographer:     Sedonia Small Rodgers-Jones Referring Phys:  [id number] Lilia Pro Diagnosing Phys: Yvonne Kendall MD IMPRESSIONS  1. Left ventricular ejection fraction, by estimation, is 60 to 65%. The left ventricle has normal function. The left ventricle has no regional wall motion abnormalities. Left ventricular diastolic parameters were normal.  2. Right ventricular systolic function is low normal. The right ventricular size is mildly enlarged. There is mildly elevated pulmonary  artery systolic pressure.  3. Right atrial size was mildly dilated.  4. The pericardial effusion is posterior to the left ventricle.  5. The mitral valve is normal in structure. Mild mitral valve regurgitation. No evidence of mitral stenosis.  6. The aortic valve is tricuspid. Aortic valve regurgitation is not visualized. No aortic stenosis is present.  7. The inferior vena cava is normal in size with <50% respiratory variability, suggesting right atrial pressure of 8 mmHg. FINDINGS  Left Ventricle: Left ventricular ejection fraction, by estimation, is 60 to 65%. The left ventricle has normal function. The left ventricle has no regional wall motion abnormalities. The left ventricular internal cavity size was normal in size. There is  no left ventricular hypertrophy. Left ventricular diastolic parameters were normal. Right Ventricle: The right ventricular size is mildly enlarged. No increase in right ventricular wall thickness. Right ventricular systolic function is low normal. There is mildly elevated pulmonary artery systolic pressure. The tricuspid regurgitant velocity is 3.02 m/s, and with an assumed right atrial pressure of 8 mmHg, the estimated right ventricular systolic pressure is 44.5 mmHg. Left Atrium: Left atrial size was normal in size. Right Atrium: Right atrial size was mildly dilated. Pericardium: Trivial pericardial effusion is present. The pericardial effusion is posterior to the left ventricle. Mitral Valve: The mitral valve is normal in structure. Mild mitral valve regurgitation. No evidence of mitral valve stenosis. Tricuspid Valve: The tricuspid valve is normal in structure. Tricuspid valve regurgitation is mild. Aortic Valve: The aortic valve is tricuspid. Aortic valve regurgitation is not visualized. No aortic stenosis is present. Pulmonic Valve: The pulmonic valve was grossly normal. Pulmonic valve regurgitation is not  visualized. No evidence of pulmonic stenosis. Aorta: The aortic root and  ascending aorta are structurally normal, with no evidence of dilitation. Pulmonary Artery: The pulmonary artery is not well seen. Venous: The inferior vena cava is normal in size with less than 50% respiratory variability, suggesting right atrial pressure of 8 mmHg. IAS/Shunts: No atrial level shunt detected by color flow Doppler.  LEFT VENTRICLE PLAX 2D LVIDd:         4.52 cm  Diastology LVIDs:         2.73 cm  LV e' medial:    10.00 cm/s LV PW:         1.05 cm  LV E/e' medial:  12.1 LV IVS:        0.86 cm  LV e' lateral:   9.25 cm/s LVOT diam:     2.00 cm  LV E/e' lateral: 13.1 LV SV:         71 LV SV Index:   41 LVOT Area:     3.14 cm  RIGHT VENTRICLE             IVC RV Basal diam:  4.44 cm     IVC diam: 1.41 cm RV S prime:     20.50 cm/s TAPSE (M-mode): 1.6 cm LEFT ATRIUM             Index       RIGHT ATRIUM           Index LA diam:        4.20 cm 2.40 cm/m  RA Area:     20.70 cm LA Vol (A2C):   55.2 ml 31.54 ml/m RA Volume:   64.20 ml  36.68 ml/m LA Vol (A4C):   42.5 ml 24.29 ml/m LA Biplane Vol: 48.8 ml 27.88 ml/m  AORTIC VALVE LVOT Vmax:   105.00 cm/s LVOT Vmean:  72.500 cm/s LVOT VTI:    0.226 m  AORTA Ao Root diam: 3.60 cm Ao Asc diam:  3.20 cm MITRAL VALVE                TRICUSPID VALVE MV Area (PHT): 3.37 cm     TR Peak grad:   36.5 mmHg MV Decel Time: 225 msec     TR Vmax:        302.00 cm/s MV E velocity: 121.00 cm/s MV A velocity: 79.00 cm/s   SHUNTS MV E/A ratio:  1.53         Systemic VTI:  0.23 m                             Systemic Diam: 2.00 cm Yvonne Kendallhristopher End MD Electronically signed by Yvonne Kendallhristopher End MD Signature Date/Time: 03/08/2021/8:25:50 AM    Final    US THORACENTESIS ASP PLEURAL SPACE W/IMG GUIDE  Result Date: 03/08/2021 INDICATION: 65 year old with history of shortness of breath and bilateral pleural effusions. EXAM: ULTRASOUND GUIDED LEFT THORACENTESIS MEDICATIONS: None. COMPLICATIONS: None immediate. PROCEDURE: An ultrasound guided thoracentesis was thoroughly discussed  with the patient and questions answered. The benefits, risks, alternatives and complications were also discussed. The patient understands and wishes to proceed with the procedure. Written consent was obtained. Ultrasound was performed to localize and mark an adequate pocket of fluid in the left chest. The area was then prepped and draped in the normal sterile fashion. 1% Lidocaine was used for local anesthesia. Under ultrasound guidance a 6 Fr Safe-T-Centesis catheter was introduced. Thoracentesis was performed.  The catheter was removed and a dressing applied. FINDINGS: A total of approximately 375 mL of dark yellow fluid was removed. Samples were sent to the laboratory as requested by the clinical team. IMPRESSION: Successful ultrasound guided left thoracentesis yielding 375 mL of pleural fluid. Electronically Signed   By: Richarda Overlie M.D.   On: 03/08/2021 15:53     Labs:   Basic Metabolic Panel: Recent Labs  Lab 03/07/21 0026 03/08/21 0439 03/08/21 1352 03/09/21 0525 03/10/21 0709  NA 137 141 142 145 140  K 2.4* 2.3* 2.8* 3.8 3.2*  CL 98 107 107 115* 112*  CO2 28 27 26 24 22   GLUCOSE 251* 120* 155* 104* 108*  BUN 20 18 16 15 17   CREATININE 1.68* 1.62* 1.57* 1.62* 1.35*  CALCIUM 8.0* 7.8* 8.2* 8.1* 8.3*  MG 2.1 1.9  --  1.7  --   PHOS  --   --   --  1.7*  --    GFR Estimated Creatinine Clearance: 46.2 mL/min (A) (by C-G formula based on SCr of 1.35 mg/dL (H)). Liver Function Tests: Recent Labs  Lab 03/08/21 0439 03/08/21 1352 03/09/21 0525 03/10/21 0709  AST 70*  --  77* 79*  ALT 191*  --  167* 147*  ALKPHOS 82  --  88 81  BILITOT 0.9  --  0.7 0.8  PROT 5.9* 6.3* 6.2* 6.2*  ALBUMIN 2.6*  --  2.8* 2.8*   No results for input(s): LIPASE, AMYLASE in the last 168 hours. No results for input(s): AMMONIA in the last 168 hours. Coagulation profile Recent Labs  Lab 03/06/21 2059 03/07/21 0026 03/08/21 0439 03/09/21 0525  INR 1.7* 2.0* 1.8* 1.5*    CBC: Recent Labs  Lab  03/06/21 1350 03/07/21 0026 03/08/21 0439 03/09/21 0525 03/10/21 0709  WBC 6.7 3.7* 6.1 6.0 6.0  NEUTROABS  --   --   --  3.8 3.5  HGB 11.2* 10.9* 9.4* 10.1* 10.6*  HCT 37.0* 35.5* 30.8* 34.3* 35.7*  MCV 97.9 95.9 97.5 100.9* 98.3  PLT 252 201 202 210 219   Cardiac Enzymes: No results for input(s): CKTOTAL, CKMB, CKMBINDEX, TROPONINI in the last 168 hours. BNP: Invalid input(s): POCBNP CBG: No results for input(s): GLUCAP in the last 168 hours. D-Dimer No results for input(s): DDIMER in the last 72 hours. Hgb A1c No results for input(s): HGBA1C in the last 72 hours. Lipid Profile No results for input(s): CHOL, HDL, LDLCALC, TRIG, CHOLHDL, LDLDIRECT in the last 72 hours. Thyroid function studies No results for input(s): TSH, T4TOTAL, T3FREE, THYROIDAB in the last 72 hours.  Invalid input(s): FREET3 Anemia work up No results for input(s): VITAMINB12, FOLATE, FERRITIN, TIBC, IRON, RETICCTPCT in the last 72 hours. Microbiology Recent Results (from the past 240 hour(s))  Resp Panel by RT-PCR (Flu A&B, Covid) Nasopharyngeal Swab     Status: None   Collection Time: 03/06/21  1:50 PM   Specimen: Nasopharyngeal Swab; Nasopharyngeal(NP) swabs in vial transport medium  Result Value Ref Range Status   SARS Coronavirus 2 by RT PCR NEGATIVE NEGATIVE Final    Comment: (NOTE) SARS-CoV-2 target nucleic acids are NOT DETECTED.  The SARS-CoV-2 RNA is generally detectable in upper respiratory specimens during the acute phase of infection. The lowest concentration of SARS-CoV-2 viral copies this assay can detect is 138 copies/mL. A negative result does not preclude SARS-Cov-2 infection and should not be used as the sole basis for treatment or other patient management decisions. A negative result may occur with  improper  specimen collection/handling, submission of specimen other than nasopharyngeal swab, presence of viral mutation(s) within the areas targeted by this assay, and inadequate  number of viral copies(<138 copies/mL). A negative result must be combined with clinical observations, patient history, and epidemiological information. The expected result is Negative.  Fact Sheet for Patients:  BloggerCourse.com  Fact Sheet for Healthcare Providers:  SeriousBroker.it  This test is no t yet approved or cleared by the [place] FDA and  has been authorized for detection and/or diagnosis of SARS-CoV-2 by FDA under an Emergency Use Authorization (EUA). This EUA will remain  in effect (meaning this test can be used) for the duration of the COVID-19 declaration under Section 564(b)(1) of the Act, 21 U.S.C.section 360bbb-3(b)(1), unless the authorization is terminated  or revoked sooner.       Influenza A by PCR NEGATIVE NEGATIVE Final   Influenza B by PCR NEGATIVE NEGATIVE Final    Comment: (NOTE) The Xpert Xpress SARS-CoV-2/FLU/RSV plus assay is intended as an aid in the diagnosis of influenza from Nasopharyngeal swab specimens and should not be used as a sole basis for treatment. Nasal washings and aspirates are unacceptable for Xpert Xpress SARS-CoV-2/FLU/RSV testing.  Fact Sheet for Patients: BloggerCourse.com  Fact Sheet for Healthcare Providers: SeriousBroker.it  This test is not yet approved or cleared by the [place] FDA and has been authorized for detection and/or diagnosis of SARS-CoV-2 by FDA under an Emergency Use Authorization (EUA). This EUA will remain in effect (meaning this test can be used) for the duration of the COVID-19 declaration under Section 564(b)(1) of the Act, 21 U.S.C. section 360bbb-3(b)(1), unless the authorization is terminated or revoked.  Performed at [facility], [address] [place], [place] [address]   Body fluid culture w Gram Stain     Status: None (Preliminary result)   Collection Time: 03/08/21   3:45 PM   Specimen: PATH Cytology Pleural fluid  Result Value Ref Range Status   Specimen Description   Final    PLEURAL Performed at [facility], [address] [place], [place] [address]    Special Requests   Final    NONE Performed at [facility], [address] [place], [place] [address]    Gram Stain   Final    RARE WBC PRESENT,BOTH PMN AND MONONUCLEAR NO ORGANISMS SEEN    Culture   Final    NO GROWTH 2 DAYS Performed at [facility], [address] [place], [place] [address]    Report Status PENDING  Incomplete     Signed: Melina Schools Novali Vollman  [facility] 03/10/2021, 12:50 PM

## 2021-03-10 NOTE — Progress Notes (Signed)
Went through discharge paper with patient, verbalize understanding, pt left POV with his granddaughter

## 2021-03-12 LAB — BODY FLUID CULTURE W GRAM STAIN: Culture: NO GROWTH

## 2022-01-11 DIAGNOSIS — Z20822 Contact with and (suspected) exposure to covid-19: Secondary | ICD-10-CM | POA: Diagnosis not present

## 2022-02-12 DIAGNOSIS — Z20822 Contact with and (suspected) exposure to covid-19: Secondary | ICD-10-CM | POA: Diagnosis not present

## 2022-02-24 DIAGNOSIS — Z20822 Contact with and (suspected) exposure to covid-19: Secondary | ICD-10-CM | POA: Diagnosis not present

## 2022-02-26 DIAGNOSIS — R634 Abnormal weight loss: Secondary | ICD-10-CM | POA: Diagnosis not present

## 2022-02-26 DIAGNOSIS — E785 Hyperlipidemia, unspecified: Secondary | ICD-10-CM | POA: Diagnosis not present

## 2022-02-26 DIAGNOSIS — M13869 Other specified arthritis, unspecified knee: Secondary | ICD-10-CM | POA: Diagnosis not present

## 2022-02-26 DIAGNOSIS — J019 Acute sinusitis, unspecified: Secondary | ICD-10-CM | POA: Diagnosis not present

## 2022-02-26 DIAGNOSIS — J301 Allergic rhinitis due to pollen: Secondary | ICD-10-CM | POA: Diagnosis not present

## 2022-02-26 DIAGNOSIS — E749 Disorder of carbohydrate metabolism, unspecified: Secondary | ICD-10-CM | POA: Diagnosis not present

## 2022-02-26 DIAGNOSIS — N189 Chronic kidney disease, unspecified: Secondary | ICD-10-CM | POA: Diagnosis not present

## 2022-02-26 DIAGNOSIS — I2510 Atherosclerotic heart disease of native coronary artery without angina pectoris: Secondary | ICD-10-CM | POA: Diagnosis not present

## 2022-02-26 DIAGNOSIS — I10 Essential (primary) hypertension: Secondary | ICD-10-CM | POA: Diagnosis not present

## 2022-03-21 DIAGNOSIS — N189 Chronic kidney disease, unspecified: Secondary | ICD-10-CM | POA: Diagnosis not present

## 2022-03-21 DIAGNOSIS — J019 Acute sinusitis, unspecified: Secondary | ICD-10-CM | POA: Diagnosis not present

## 2022-03-21 DIAGNOSIS — I10 Essential (primary) hypertension: Secondary | ICD-10-CM | POA: Diagnosis not present

## 2022-03-21 DIAGNOSIS — E749 Disorder of carbohydrate metabolism, unspecified: Secondary | ICD-10-CM | POA: Diagnosis not present

## 2022-03-21 DIAGNOSIS — M13869 Other specified arthritis, unspecified knee: Secondary | ICD-10-CM | POA: Diagnosis not present

## 2022-03-21 DIAGNOSIS — I2510 Atherosclerotic heart disease of native coronary artery without angina pectoris: Secondary | ICD-10-CM | POA: Diagnosis not present

## 2022-03-21 DIAGNOSIS — R634 Abnormal weight loss: Secondary | ICD-10-CM | POA: Diagnosis not present

## 2022-03-21 DIAGNOSIS — E785 Hyperlipidemia, unspecified: Secondary | ICD-10-CM | POA: Diagnosis not present

## 2022-03-21 DIAGNOSIS — J301 Allergic rhinitis due to pollen: Secondary | ICD-10-CM | POA: Diagnosis not present

## 2022-03-26 DIAGNOSIS — I10 Essential (primary) hypertension: Secondary | ICD-10-CM | POA: Diagnosis not present

## 2022-03-26 DIAGNOSIS — E785 Hyperlipidemia, unspecified: Secondary | ICD-10-CM | POA: Diagnosis not present

## 2022-03-26 DIAGNOSIS — I7143 Infrarenal abdominal aortic aneurysm, without rupture: Secondary | ICD-10-CM | POA: Diagnosis not present

## 2022-03-26 DIAGNOSIS — Z9861 Coronary angioplasty status: Secondary | ICD-10-CM | POA: Diagnosis not present

## 2022-03-26 DIAGNOSIS — I340 Nonrheumatic mitral (valve) insufficiency: Secondary | ICD-10-CM | POA: Diagnosis not present

## 2022-03-26 DIAGNOSIS — G4730 Sleep apnea, unspecified: Secondary | ICD-10-CM | POA: Diagnosis not present

## 2022-03-26 DIAGNOSIS — I2510 Atherosclerotic heart disease of native coronary artery without angina pectoris: Secondary | ICD-10-CM | POA: Diagnosis not present

## 2022-03-30 DIAGNOSIS — I10 Essential (primary) hypertension: Secondary | ICD-10-CM | POA: Diagnosis not present

## 2022-03-30 DIAGNOSIS — E785 Hyperlipidemia, unspecified: Secondary | ICD-10-CM | POA: Diagnosis not present

## 2022-03-30 DIAGNOSIS — I340 Nonrheumatic mitral (valve) insufficiency: Secondary | ICD-10-CM | POA: Diagnosis not present

## 2022-03-30 DIAGNOSIS — I7143 Infrarenal abdominal aortic aneurysm, without rupture: Secondary | ICD-10-CM | POA: Diagnosis not present

## 2022-03-30 DIAGNOSIS — G4739 Other sleep apnea: Secondary | ICD-10-CM | POA: Diagnosis not present

## 2022-03-30 DIAGNOSIS — I2510 Atherosclerotic heart disease of native coronary artery without angina pectoris: Secondary | ICD-10-CM | POA: Diagnosis not present

## 2022-03-30 DIAGNOSIS — Z9861 Coronary angioplasty status: Secondary | ICD-10-CM | POA: Diagnosis not present

## 2022-04-11 DIAGNOSIS — I7143 Infrarenal abdominal aortic aneurysm, without rupture: Secondary | ICD-10-CM | POA: Diagnosis not present

## 2022-04-12 DIAGNOSIS — E785 Hyperlipidemia, unspecified: Secondary | ICD-10-CM | POA: Diagnosis not present

## 2022-04-12 DIAGNOSIS — R0602 Shortness of breath: Secondary | ICD-10-CM | POA: Diagnosis not present

## 2022-04-12 DIAGNOSIS — G4730 Sleep apnea, unspecified: Secondary | ICD-10-CM | POA: Diagnosis not present

## 2022-04-12 DIAGNOSIS — I2510 Atherosclerotic heart disease of native coronary artery without angina pectoris: Secondary | ICD-10-CM | POA: Diagnosis not present

## 2022-04-12 DIAGNOSIS — I340 Nonrheumatic mitral (valve) insufficiency: Secondary | ICD-10-CM | POA: Diagnosis not present

## 2022-04-12 DIAGNOSIS — I10 Essential (primary) hypertension: Secondary | ICD-10-CM | POA: Diagnosis not present

## 2022-04-13 DIAGNOSIS — R0602 Shortness of breath: Secondary | ICD-10-CM | POA: Diagnosis not present

## 2022-04-13 DIAGNOSIS — I2510 Atherosclerotic heart disease of native coronary artery without angina pectoris: Secondary | ICD-10-CM | POA: Diagnosis not present

## 2022-04-13 DIAGNOSIS — E785 Hyperlipidemia, unspecified: Secondary | ICD-10-CM | POA: Diagnosis not present

## 2022-04-13 DIAGNOSIS — G4730 Sleep apnea, unspecified: Secondary | ICD-10-CM | POA: Diagnosis not present

## 2022-04-13 DIAGNOSIS — I10 Essential (primary) hypertension: Secondary | ICD-10-CM | POA: Diagnosis not present

## 2022-04-13 DIAGNOSIS — I340 Nonrheumatic mitral (valve) insufficiency: Secondary | ICD-10-CM | POA: Diagnosis not present

## 2022-04-20 DIAGNOSIS — R7301 Impaired fasting glucose: Secondary | ICD-10-CM | POA: Diagnosis not present

## 2022-04-20 DIAGNOSIS — E785 Hyperlipidemia, unspecified: Secondary | ICD-10-CM | POA: Diagnosis not present

## 2022-04-20 DIAGNOSIS — N189 Chronic kidney disease, unspecified: Secondary | ICD-10-CM | POA: Diagnosis not present

## 2022-04-20 DIAGNOSIS — E749 Disorder of carbohydrate metabolism, unspecified: Secondary | ICD-10-CM | POA: Diagnosis not present

## 2022-04-23 DIAGNOSIS — R634 Abnormal weight loss: Secondary | ICD-10-CM | POA: Diagnosis not present

## 2022-04-23 DIAGNOSIS — I2510 Atherosclerotic heart disease of native coronary artery without angina pectoris: Secondary | ICD-10-CM | POA: Diagnosis not present

## 2022-04-23 DIAGNOSIS — E749 Disorder of carbohydrate metabolism, unspecified: Secondary | ICD-10-CM | POA: Diagnosis not present

## 2022-04-23 DIAGNOSIS — I10 Essential (primary) hypertension: Secondary | ICD-10-CM | POA: Diagnosis not present

## 2022-04-23 DIAGNOSIS — M13869 Other specified arthritis, unspecified knee: Secondary | ICD-10-CM | POA: Diagnosis not present

## 2022-04-23 DIAGNOSIS — E785 Hyperlipidemia, unspecified: Secondary | ICD-10-CM | POA: Diagnosis not present

## 2022-04-23 DIAGNOSIS — J019 Acute sinusitis, unspecified: Secondary | ICD-10-CM | POA: Diagnosis not present

## 2022-04-23 DIAGNOSIS — J301 Allergic rhinitis due to pollen: Secondary | ICD-10-CM | POA: Diagnosis not present

## 2022-04-23 DIAGNOSIS — N189 Chronic kidney disease, unspecified: Secondary | ICD-10-CM | POA: Diagnosis not present

## 2022-05-08 DIAGNOSIS — I2510 Atherosclerotic heart disease of native coronary artery without angina pectoris: Secondary | ICD-10-CM | POA: Diagnosis not present

## 2022-05-08 DIAGNOSIS — J019 Acute sinusitis, unspecified: Secondary | ICD-10-CM | POA: Diagnosis not present

## 2022-05-08 DIAGNOSIS — M13869 Other specified arthritis, unspecified knee: Secondary | ICD-10-CM | POA: Diagnosis not present

## 2022-05-08 DIAGNOSIS — R634 Abnormal weight loss: Secondary | ICD-10-CM | POA: Diagnosis not present

## 2022-05-08 DIAGNOSIS — R252 Cramp and spasm: Secondary | ICD-10-CM | POA: Diagnosis not present

## 2022-05-08 DIAGNOSIS — E785 Hyperlipidemia, unspecified: Secondary | ICD-10-CM | POA: Diagnosis not present

## 2022-05-08 DIAGNOSIS — J301 Allergic rhinitis due to pollen: Secondary | ICD-10-CM | POA: Diagnosis not present

## 2022-05-08 DIAGNOSIS — E749 Disorder of carbohydrate metabolism, unspecified: Secondary | ICD-10-CM | POA: Diagnosis not present

## 2022-05-08 DIAGNOSIS — I10 Essential (primary) hypertension: Secondary | ICD-10-CM | POA: Diagnosis not present

## 2022-05-08 DIAGNOSIS — N189 Chronic kidney disease, unspecified: Secondary | ICD-10-CM | POA: Diagnosis not present

## 2022-07-31 DIAGNOSIS — E291 Testicular hypofunction: Secondary | ICD-10-CM | POA: Diagnosis not present

## 2022-07-31 DIAGNOSIS — E749 Disorder of carbohydrate metabolism, unspecified: Secondary | ICD-10-CM | POA: Diagnosis not present

## 2022-07-31 DIAGNOSIS — R7301 Impaired fasting glucose: Secondary | ICD-10-CM | POA: Diagnosis not present

## 2022-07-31 DIAGNOSIS — I10 Essential (primary) hypertension: Secondary | ICD-10-CM | POA: Diagnosis not present

## 2022-07-31 DIAGNOSIS — R634 Abnormal weight loss: Secondary | ICD-10-CM | POA: Diagnosis not present

## 2022-07-31 DIAGNOSIS — E785 Hyperlipidemia, unspecified: Secondary | ICD-10-CM | POA: Diagnosis not present

## 2022-07-31 DIAGNOSIS — Z125 Encounter for screening for malignant neoplasm of prostate: Secondary | ICD-10-CM | POA: Diagnosis not present

## 2022-08-08 DIAGNOSIS — J301 Allergic rhinitis due to pollen: Secondary | ICD-10-CM | POA: Diagnosis not present

## 2022-08-08 DIAGNOSIS — R252 Cramp and spasm: Secondary | ICD-10-CM | POA: Diagnosis not present

## 2022-08-08 DIAGNOSIS — Z739 Problem related to life management difficulty, unspecified: Secondary | ICD-10-CM | POA: Diagnosis not present

## 2022-08-08 DIAGNOSIS — N411 Chronic prostatitis: Secondary | ICD-10-CM | POA: Diagnosis not present

## 2022-08-08 DIAGNOSIS — Z0001 Encounter for general adult medical examination with abnormal findings: Secondary | ICD-10-CM | POA: Diagnosis not present

## 2022-08-08 DIAGNOSIS — M13869 Other specified arthritis, unspecified knee: Secondary | ICD-10-CM | POA: Diagnosis not present

## 2022-08-08 DIAGNOSIS — Z23 Encounter for immunization: Secondary | ICD-10-CM | POA: Diagnosis not present

## 2022-08-08 DIAGNOSIS — I10 Essential (primary) hypertension: Secondary | ICD-10-CM | POA: Diagnosis not present

## 2022-08-08 DIAGNOSIS — I2510 Atherosclerotic heart disease of native coronary artery without angina pectoris: Secondary | ICD-10-CM | POA: Diagnosis not present

## 2022-08-08 DIAGNOSIS — N189 Chronic kidney disease, unspecified: Secondary | ICD-10-CM | POA: Diagnosis not present

## 2022-08-08 DIAGNOSIS — R4181 Age-related cognitive decline: Secondary | ICD-10-CM | POA: Diagnosis not present

## 2022-08-08 DIAGNOSIS — Z1389 Encounter for screening for other disorder: Secondary | ICD-10-CM | POA: Diagnosis not present

## 2022-08-08 DIAGNOSIS — R634 Abnormal weight loss: Secondary | ICD-10-CM | POA: Diagnosis not present

## 2022-08-08 DIAGNOSIS — Z1331 Encounter for screening for depression: Secondary | ICD-10-CM | POA: Diagnosis not present

## 2022-09-24 DIAGNOSIS — G3184 Mild cognitive impairment, so stated: Secondary | ICD-10-CM | POA: Diagnosis not present

## 2022-09-24 DIAGNOSIS — I10 Essential (primary) hypertension: Secondary | ICD-10-CM | POA: Diagnosis not present

## 2022-09-24 DIAGNOSIS — M13869 Other specified arthritis, unspecified knee: Secondary | ICD-10-CM | POA: Diagnosis not present

## 2022-09-24 DIAGNOSIS — I2510 Atherosclerotic heart disease of native coronary artery without angina pectoris: Secondary | ICD-10-CM | POA: Diagnosis not present

## 2022-09-24 DIAGNOSIS — J019 Acute sinusitis, unspecified: Secondary | ICD-10-CM | POA: Diagnosis not present

## 2022-09-24 DIAGNOSIS — N189 Chronic kidney disease, unspecified: Secondary | ICD-10-CM | POA: Diagnosis not present

## 2022-09-24 DIAGNOSIS — J301 Allergic rhinitis due to pollen: Secondary | ICD-10-CM | POA: Diagnosis not present

## 2022-09-24 DIAGNOSIS — E749 Disorder of carbohydrate metabolism, unspecified: Secondary | ICD-10-CM | POA: Diagnosis not present

## 2022-09-24 DIAGNOSIS — R252 Cramp and spasm: Secondary | ICD-10-CM | POA: Diagnosis not present

## 2022-09-24 DIAGNOSIS — R634 Abnormal weight loss: Secondary | ICD-10-CM | POA: Diagnosis not present

## 2022-09-24 DIAGNOSIS — E785 Hyperlipidemia, unspecified: Secondary | ICD-10-CM | POA: Diagnosis not present

## 2022-09-24 DIAGNOSIS — N411 Chronic prostatitis: Secondary | ICD-10-CM | POA: Diagnosis not present

## 2022-12-04 ENCOUNTER — Encounter: Payer: Self-pay | Admitting: Internal Medicine

## 2022-12-04 ENCOUNTER — Other Ambulatory Visit: Payer: Self-pay

## 2022-12-04 ENCOUNTER — Ambulatory Visit (INDEPENDENT_AMBULATORY_CARE_PROVIDER_SITE_OTHER): Payer: Medicare HMO | Admitting: Internal Medicine

## 2022-12-04 VITALS — BP 140/90 | HR 105 | Ht 65.0 in | Wt 180.6 lb

## 2022-12-04 DIAGNOSIS — K219 Gastro-esophageal reflux disease without esophagitis: Secondary | ICD-10-CM

## 2022-12-04 DIAGNOSIS — E669 Obesity, unspecified: Secondary | ICD-10-CM

## 2022-12-04 DIAGNOSIS — J301 Allergic rhinitis due to pollen: Secondary | ICD-10-CM

## 2022-12-04 DIAGNOSIS — E785 Hyperlipidemia, unspecified: Secondary | ICD-10-CM

## 2022-12-04 DIAGNOSIS — I10 Essential (primary) hypertension: Secondary | ICD-10-CM

## 2022-12-04 DIAGNOSIS — F01B0 Vascular dementia, moderate, without behavioral disturbance, psychotic disturbance, mood disturbance, and anxiety: Secondary | ICD-10-CM

## 2022-12-04 DIAGNOSIS — E782 Mixed hyperlipidemia: Secondary | ICD-10-CM

## 2022-12-04 MED ORDER — LORATADINE 10 MG PO TABS: 10.0000 mg | ORAL_TABLET | Freq: Every day | ORAL | 0 refills | Status: DC

## 2022-12-04 MED ORDER — PANTOPRAZOLE SODIUM 40 MG PO TBEC: 40.0000 mg | DELAYED_RELEASE_TABLET | Freq: Every morning | ORAL | 0 refills | Status: DC

## 2022-12-04 NOTE — Progress Notes (Signed)
Established Patient Office Visit  Subjective:  Patient ID: Noah Dillon, male    DOB: 1956/03/25  Age: 67 y.o. MRN: [id number]  Chief Complaint  Patient presents with   Follow-up    Follow up    No new complaints, here for CT review and medication refills. Ct notable for small vessel ischemic changes.      Past Medical History:  Diagnosis Date   Hemorrhoids    Hyperlipidemia    Hypertension    Myocardial infarction [facility])     Social History   Socioeconomic History   Marital status: Married    Spouse name: Not on file   Number of children: Not on file   Years of education: Not on file   Highest education level: Not on file  Occupational History   Not on file  Tobacco Use   Smoking status: Former    Packs/day: 1.00    Years: 8.00    Total pack years: 8.00    Types: Cigarettes   Smokeless tobacco: Never  Substance and Sexual Activity   Alcohol use: Yes   Drug use: No   Sexual activity: Not on file  Other Topics Concern   Not on file  Social History Narrative   Not on file   Social Determinants of Health   Financial Resource Strain: Not on file  Food Insecurity: Not on file  Transportation Needs: Not on file  Physical Activity: Not on file  Stress: Not on file  Social Connections: Not on file  Intimate Partner Violence: Not on file    No family history on file.  Allergies  Allergen Reactions   Ace Inhibitors Swelling    Review of Systems  Constitutional: Negative.   HENT: Negative.    Eyes: Negative.   Respiratory: Negative.    Cardiovascular: Negative.   Gastrointestinal:  Positive for heartburn.       Belching  Genitourinary: Negative.   Skin: Negative.   Neurological: Negative.   Endo/Heme/Allergies: Negative.        Objective:   BP (!) 140/90   Pulse (!) 105   Ht 5' 5"$  (1.651 m)   Wt 180 lb 9.6 oz (81.9 kg)   SpO2 94%   BMI 30.05 kg/m   Vitals:   12/04/22 1041  BP: (!) 140/90  Pulse: (!) 105  Height: 5' 5"$  (1.651 m)   Weight: 180 lb 9.6 oz (81.9 kg)  SpO2: 94%  BMI (Calculated): 30.05    Physical Exam Vitals reviewed.  Constitutional:      Appearance: Normal appearance. He is obese.  HENT:     Head: Normocephalic.     Left Ear: There is no impacted cerumen.     Nose: Nose normal.     Mouth/Throat:     Mouth: Mucous membranes are moist.     Pharynx: No posterior oropharyngeal erythema.  Eyes:     Extraocular Movements: Extraocular movements intact.     Pupils: Pupils are equal, round, and reactive to light.  Cardiovascular:     Rate and Rhythm: Regular rhythm.     Chest Wall: PMI is not displaced.     Pulses: Normal pulses.     Heart sounds: Normal heart sounds. No murmur heard. Pulmonary:     Effort: Pulmonary effort is normal.     Breath sounds: Normal air entry. No rhonchi or rales.  Abdominal:     General: Abdomen is flat. Bowel sounds are normal. There is no distension.  Palpations: Abdomen is soft. There is no hepatomegaly, splenomegaly or mass.     Tenderness: There is no abdominal tenderness.  Musculoskeletal:        General: Normal range of motion.     Cervical back: Normal range of motion and neck supple.     Right lower leg: No edema.     Left lower leg: No edema.  Skin:    General: Skin is warm and dry.  Neurological:     General: No focal deficit present.     Mental Status: He is alert and oriented to person, place, and time.     Cranial Nerves: No cranial nerve deficit.     Motor: No weakness.  Psychiatric:        Mood and Affect: Mood normal.        Behavior: Behavior normal.      No results found for any visits on 12/04/22.  No results found for this or any previous visit (from the past 2160 hour(Alani Lacivita)).    Assessment & Plan:   Problem List Items Addressed This Visit   None 1. GERD without esophagitis - pantoprazole (PROTONIX) 40 MG tablet; Take 1 tablet (40 mg total) by mouth every morning.  Dispense: 90 tablet; Refill: 0  2. Allergic rhinitis due to  pollen, unspecified seasonality  3. Moderate vascular dementia without behavioral disturbance, psychotic disturbance, mood disturbance, or anxiety (HCC)  4. Mixed hyperlipidemia - Comprehensive metabolic panel - Lipid panel - CK, total    @VISPATINSTR$ @  No follow-ups on file.   Total time spent: 20 minutes  Volanda Napoleon, MD  12/04/2022

## 2022-12-04 NOTE — Patient Instructions (Signed)
Fat and Cholesterol Restricted Eating Plan Getting too much fat and cholesterol in your diet may cause health problems. Choosing the right foods helps keep your fat and cholesterol at normal levels. This can keep you from getting certain diseases. Your doctor may recommend an eating plan that includes: Total fat: ______% or less of total calories a day. This is ______g of fat a day. Saturated fat: ______% or less of total calories a day. This is ______g of saturated fat a day. Cholesterol: less than _________mg a day. Fiber: ______g a day. What are tips for following this plan? General tips Work with your doctor to lose weight if you need to. Avoid: Foods with added sugar. Fried foods. Foods with trans fat or partially hydrogenated oils. This includes some margarines and baked goods. If you drink alcohol: Limit how much you have to: 0-1 drink a day for women who are not pregnant. 0-2 drinks a day for men. Know how much alcohol is in a drink. In the [employer] one drink equals one 12 oz bottle of beer (355 mL), one 5 oz glass of wine (148 mL), or one 1 oz glass of hard liquor (44 mL). Reading food labels Check food labels for: Trans fats. Partially hydrogenated oils. Saturated fat (g) in each serving. Cholesterol (mg) in each serving. Fiber (g) in each serving. Choose foods with healthy fats, such as: Monounsaturated fats and polyunsaturated fats. These include olive and canola oil, flaxseeds, walnuts, almonds, and seeds. Omega-3 fats. These are found in certain fish, flaxseed oil, and ground flaxseeds. Choose grain products that have whole grains. Look for the word "whole" as the first word in the ingredient list. Cooking Cook foods using low-fat methods. These include baking, boiling, grilling, and broiling. Eat more home-cooked foods. Eat at restaurants and buffets less often. Eat less fast food. Avoid cooking using saturated fats, such as butter, cream, palm oil, palm kernel oil, and  coconut oil. Meal planning  At meals, divide your plate into four equal parts: Fill one-half of your plate with vegetables, green salads, and fruit. Fill one-fourth of your plate with whole grains. Fill one-fourth of your plate with low-fat (lean) protein foods. Eat fish that is high in omega-3 fats at least two times a week. This includes mackerel, tuna, sardines, and salmon. Eat foods that are high in fiber, such as whole grains, beans, apples, pears, berries, broccoli, carrots, peas, and barley. What foods should I eat? Fruits All fresh, canned (in natural juice), or frozen fruits. Vegetables Fresh or frozen vegetables (raw, steamed, roasted, or grilled). Green salads. Grains Whole grains, such as whole wheat or whole grain breads, crackers, cereals, and pasta. Unsweetened oatmeal, bulgur, barley, quinoa, or brown rice. Corn or whole wheat flour tortillas. Meats and other protein foods Ground beef (85% or leaner), grass-fed beef, or beef trimmed of fat. Skinless chicken or turkey. Ground chicken or turkey. Pork trimmed of fat. All fish and seafood. Egg whites. Dried beans, peas, or lentils. Unsalted nuts or seeds. Unsalted canned beans. Nut butters without added sugar or oil. Dairy Low-fat or nonfat dairy products, such as skim or 1% milk, 2% or reduced-fat cheeses, low-fat and fat-free ricotta or cottage cheese, or plain low-fat and nonfat yogurt. Fats and oils Tub margarine without trans fats. Light or reduced-fat mayonnaise and salad dressings. Avocado. Olive, canola, sesame, or safflower oils. The items listed above may not be a complete list of foods and beverages you can eat. Contact a dietitian for more information. What foods   should I avoid? Fruits Canned fruit in heavy syrup. Fruit in cream or butter sauce. Fried fruit. Vegetables Vegetables cooked in cheese, cream, or butter sauce. Fried vegetables. Grains White bread. White pasta. White rice. Cornbread. Bagels, pastries,  and croissants. Crackers and snack foods that contain trans fat and hydrogenated oils. Meats and other protein foods Fatty cuts of meat. Ribs, chicken wings, bacon, sausage, bologna, salami, chitterlings, fatback, hot dogs, bratwurst, and packaged lunch meats. Liver and organ meats. Whole eggs and egg yolks. Chicken and [place] with skin. Fried meat. Dairy Whole or 2% milk, cream, half-and-half, and cream cheese. Whole milk cheeses. Whole-fat or sweetened yogurt. Full-fat cheeses. Nondairy creamers and whipped toppings. Processed cheese, cheese spreads, and cheese curds. Fats and oils Butter, stick margarine, lard, shortening, ghee, or bacon fat. Coconut, palm kernel, and palm oils. Beverages Alcohol. Sugar-sweetened drinks such as sodas, lemonade, and fruit drinks. Sweets and desserts Corn syrup, sugars, honey, and molasses. Candy. Jam and jelly. Syrup. Sweetened cereals. Cookies, pies, cakes, donuts, muffins, and ice cream. The items listed above may not be a complete list of foods and beverages you should avoid. Contact a dietitian for more information. Summary Choosing the right foods helps keep your fat and cholesterol at normal levels. This can keep you from getting certain diseases. At meals, fill one-half of your plate with vegetables, green salads, and fruits. Eat high fiber foods, like whole grains, beans, apples, pears, berries, carrots, peas, and barley. Limit added sugar, saturated fats, alcohol, and fried foods. This information is not intended to replace advice given to you by your health care provider. Make sure you discuss any questions you have with your health care provider. Document Revised: 02/17/2021 Document Reviewed: 02/17/2021 Elsevier Patient Education  [employer].

## 2022-12-05 LAB — COMPREHENSIVE METABOLIC PANEL
ALT: 24 IU/L (ref 0–44)
AST: 29 IU/L (ref 0–40)
Albumin/Globulin Ratio: 1.4 (ref 1.2–2.2)
Albumin: 4.9 g/dL (ref 3.9–4.9)
Alkaline Phosphatase: 98 IU/L (ref 44–121)
BUN/Creatinine Ratio: 12 (ref 10–24)
BUN: 20 mg/dL (ref 8–27)
Bilirubin Total: 0.5 mg/dL (ref 0.0–1.2)
CO2: 17 mmol/L — ABNORMAL LOW (ref 20–29)
Calcium: 9.5 mg/dL (ref 8.6–10.2)
Chloride: 96 mmol/L (ref 96–106)
Creatinine, Ser: 1.64 mg/dL — ABNORMAL HIGH (ref 0.76–1.27)
Globulin, Total: 3.5 g/dL (ref 1.5–4.5)
Glucose: 88 mg/dL (ref 70–99)
Potassium: 4.3 mmol/L (ref 3.5–5.2)
Sodium: 136 mmol/L (ref 134–144)
Total Protein: 8.4 g/dL (ref 6.0–8.5)
eGFR: 45 mL/min/{1.73_m2} — ABNORMAL LOW (ref >59)

## 2022-12-05 LAB — LIPID PANEL
Chol/HDL Ratio: 3.6 ratio (ref 0.0–5.0)
Cholesterol, Total: 285 mg/dL — ABNORMAL HIGH (ref 100–199)
HDL: 80 mg/dL (ref >39)
LDL Chol Calc (NIH): 183 mg/dL — ABNORMAL HIGH (ref 0–99)
Triglycerides: 126 mg/dL (ref 0–149)
VLDL Cholesterol Cal: 22 mg/dL (ref 5–40)

## 2022-12-05 LAB — CK: Total CK: 129 U/L (ref 41–331)

## 2022-12-06 ENCOUNTER — Other Ambulatory Visit: Payer: Self-pay | Admitting: Internal Medicine

## 2023-03-03 ENCOUNTER — Other Ambulatory Visit: Payer: Self-pay | Admitting: Internal Medicine

## 2023-03-03 DIAGNOSIS — K219 Gastro-esophageal reflux disease without esophagitis: Secondary | ICD-10-CM

## 2023-03-04 ENCOUNTER — Ambulatory Visit: Payer: Medicare HMO | Admitting: Internal Medicine

## 2023-05-25 ENCOUNTER — Other Ambulatory Visit: Payer: Self-pay | Admitting: Internal Medicine

## 2023-05-27 ENCOUNTER — Other Ambulatory Visit: Payer: Self-pay | Admitting: Internal Medicine

## 2023-06-07 ENCOUNTER — Other Ambulatory Visit: Payer: Self-pay | Admitting: Internal Medicine

## 2023-06-07 ENCOUNTER — Telehealth: Payer: Self-pay | Admitting: Internal Medicine

## 2023-06-07 DIAGNOSIS — K219 Gastro-esophageal reflux disease without esophagitis: Secondary | ICD-10-CM

## 2023-06-07 MED ORDER — PANTOPRAZOLE SODIUM 40 MG PO TBEC: 40.0000 mg | DELAYED_RELEASE_TABLET | Freq: Every day | ORAL | 0 refills | Status: DC

## 2023-06-07 MED ORDER — METOPROLOL SUCCINATE ER 100 MG PO TB24: 100.0000 mg | ORAL_TABLET | Freq: Every day | ORAL | 0 refills | Status: AC

## 2023-06-07 NOTE — Telephone Encounter (Signed)
Patient left VM, did not state what he needed.

## 2023-06-16 ENCOUNTER — Other Ambulatory Visit: Payer: Self-pay | Admitting: Internal Medicine

## 2023-06-24 ENCOUNTER — Other Ambulatory Visit: Payer: Self-pay | Admitting: Internal Medicine

## 2023-06-24 DIAGNOSIS — K219 Gastro-esophageal reflux disease without esophagitis: Secondary | ICD-10-CM

## 2023-06-25 ENCOUNTER — Ambulatory Visit: Payer: Medicare HMO | Admitting: Internal Medicine

## 2024-03-23 DIAGNOSIS — I469 Cardiac arrest, cause unspecified: Secondary | ICD-10-CM | POA: Diagnosis not present

## 2024-04-21 DEATH — deceased
